# Patient Record
Sex: Male | Born: 1980 | Race: Black or African American | Hispanic: No | Marital: Single | Smoking: Current every day smoker
Health system: Southern US, Community
[De-identification: ages and names within clinical notes are randomized; demographics above are authoritative.]

## PROBLEM LIST (undated history)

## (undated) DIAGNOSIS — S52502A Unspecified fracture of the lower end of left radius, initial encounter for closed fracture: Secondary | ICD-10-CM

---
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
## 2002-08-07 ENCOUNTER — Emergency Department (HOSPITAL_COMMUNITY): Admission: EM | Admit: 2002-08-07 | Discharge: 2002-08-07 | Payer: Self-pay | Admitting: Emergency Medicine

## 2002-08-25 ENCOUNTER — Encounter: Payer: Self-pay | Admitting: Family Medicine

## 2002-08-25 ENCOUNTER — Encounter: Admission: RE | Admit: 2002-08-25 | Discharge: 2002-08-25 | Payer: Self-pay | Admitting: Family Medicine

## 2004-06-08 ENCOUNTER — Emergency Department (HOSPITAL_COMMUNITY): Admission: AC | Admit: 2004-06-08 | Discharge: 2004-06-08 | Payer: Self-pay

## 2004-06-15 ENCOUNTER — Emergency Department (HOSPITAL_COMMUNITY): Admission: EM | Admit: 2004-06-15 | Discharge: 2004-06-15 | Payer: Self-pay | Admitting: Emergency Medicine

## 2004-12-14 ENCOUNTER — Emergency Department (HOSPITAL_COMMUNITY): Admission: EM | Admit: 2004-12-14 | Discharge: 2004-12-14 | Payer: Self-pay | Admitting: Emergency Medicine

## 2008-06-08 ENCOUNTER — Emergency Department (HOSPITAL_COMMUNITY): Admission: EM | Admit: 2008-06-08 | Discharge: 2008-06-08 | Payer: Self-pay | Admitting: Emergency Medicine

## 2008-07-28 ENCOUNTER — Inpatient Hospital Stay (HOSPITAL_COMMUNITY): Admission: EM | Admit: 2008-07-28 | Discharge: 2008-07-31 | Payer: Self-pay | Admitting: Emergency Medicine

## 2008-07-29 HISTORY — PX: FEMUR FRACTURE SURGERY: SHX633

## 2008-07-29 HISTORY — PX: WOUND DEBRIDEMENT: SHX247

## 2008-07-29 HISTORY — PX: KNEE ASPIRATION: SHX1892

## 2008-09-26 ENCOUNTER — Emergency Department (HOSPITAL_COMMUNITY): Admission: EM | Admit: 2008-09-26 | Discharge: 2008-09-26 | Payer: Self-pay | Admitting: Emergency Medicine

## 2010-09-16 ENCOUNTER — Emergency Department (HOSPITAL_COMMUNITY)
Admission: EM | Admit: 2010-09-16 | Discharge: 2010-09-16 | Payer: Self-pay | Source: Home / Self Care | Admitting: Emergency Medicine

## 2010-11-09 ENCOUNTER — Emergency Department (HOSPITAL_COMMUNITY)
Admission: EM | Admit: 2010-11-09 | Discharge: 2010-11-09 | Payer: Self-pay | Source: Home / Self Care | Admitting: Emergency Medicine

## 2011-01-08 ENCOUNTER — Inpatient Hospital Stay (INDEPENDENT_AMBULATORY_CARE_PROVIDER_SITE_OTHER)
Admission: RE | Admit: 2011-01-08 | Discharge: 2011-01-08 | Disposition: A | Discharge status: Home / Self Care | Payer: Self-pay | Source: Ambulatory Visit | Attending: Emergency Medicine | Admitting: Emergency Medicine

## 2011-01-08 DIAGNOSIS — A64 Unspecified sexually transmitted disease: Secondary | ICD-10-CM

## 2011-01-08 DIAGNOSIS — R369 Urethral discharge, unspecified: Secondary | ICD-10-CM
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
## 2011-08-24 LAB — GC/CHLAMYDIA PROBE AMP, GENITAL
Chlamydia, DNA Probe: NEGATIVE
GC Probe Amp, Genital: POSITIVE — AB

## 2011-08-30 LAB — CBC
HCT: 32.8 — ABNORMAL LOW
HCT: 39.8
Hemoglobin: 11.4 — ABNORMAL LOW
Hemoglobin: 11.4 — ABNORMAL LOW
Hemoglobin: 11.8 — ABNORMAL LOW
MCHC: 33.1
MCHC: 33.8
MCHC: 34.7
MCV: 101.7 — ABNORMAL HIGH
MCV: 99.3
Platelets: 164
Platelets: 173
Platelets: 206
RBC: 3.31 — ABNORMAL LOW
RBC: 3.91 — ABNORMAL LOW
RDW: 12.6
RDW: 12.8
RDW: 12.9
WBC: 4.9
WBC: 9.1

## 2011-08-30 LAB — BASIC METABOLIC PANEL
BUN: 2 — ABNORMAL LOW
BUN: 4 — ABNORMAL LOW
CO2: 28
CO2: 29
Calcium: 8.4
Calcium: 9
Calcium: 9.1
Chloride: 104
GFR calc Af Amer: >60
GFR calc non Af Amer: >60
GFR calc non Af Amer: >60
Glucose, Bld: 106 — ABNORMAL HIGH
Glucose, Bld: 109 — ABNORMAL HIGH
Glucose, Bld: 93
Potassium: 3.7
Sodium: 136
Sodium: 138
Sodium: 138

## 2011-08-30 LAB — POCT I-STAT, CHEM 8
BUN: 8
Chloride: 105
Creatinine, Ser: 1
Sodium: 140

## 2011-08-30 LAB — ETHANOL: Alcohol, Ethyl (B): 206 — ABNORMAL HIGH

## 2011-08-30 LAB — PROTIME-INR: INR: 1

## 2013-12-23 ENCOUNTER — Emergency Department (INDEPENDENT_AMBULATORY_CARE_PROVIDER_SITE_OTHER)
Admission: EM | Admit: 2013-12-23 | Discharge: 2013-12-23 | Disposition: A | Discharge status: Home / Self Care | Payer: Self-pay | Source: Home / Self Care | Attending: Family Medicine | Admitting: Family Medicine

## 2013-12-23 ENCOUNTER — Encounter (HOSPITAL_COMMUNITY): Payer: Self-pay | Admitting: Emergency Medicine

## 2013-12-23 DIAGNOSIS — K089 Disorder of teeth and supporting structures, unspecified: Secondary | ICD-10-CM

## 2013-12-23 DIAGNOSIS — K0889 Other specified disorders of teeth and supporting structures: Secondary | ICD-10-CM

## 2013-12-23 DIAGNOSIS — K029 Dental caries, unspecified: Secondary | ICD-10-CM

## 2013-12-23 MED ORDER — IBUPROFEN 800 MG PO TABS: 800.0000 mg | ORAL_TABLET | Freq: Three times a day (TID) | ORAL | Status: DC

## 2013-12-23 MED ORDER — AMOXICILLIN 875 MG PO TABS: 875.0000 mg | ORAL_TABLET | Freq: Two times a day (BID) | ORAL | Status: DC

## 2013-12-23 MED ORDER — HYDROCODONE-ACETAMINOPHEN 5-325 MG PO TABS: 1.0000 | ORAL_TABLET | Freq: Four times a day (QID) | ORAL | Status: DC | PRN

## 2013-12-23 NOTE — ED Notes (Signed)
C/o  Dental pain pain bottom left broken tooth.  Having pain and swelling no relief with otc meds.  On going for some time now off/on.

## 2013-12-23 NOTE — ED Provider Notes (Signed)
CSN: [id number]     Arrival date & time 12/23/13  1453 History   None    Chief Complaint  Patient presents with  . Dental Pain   (Consider location/radiation/quality/duration/timing/severity/associated sxs/prior Treatment) HPI 33 year old male presents complaining of dental caries, left face pain, left headache, swollen lymph nodes in the left side of his neck. This is been present for about one week, getting progressively worse. He has already worked on finding a Education officer, community and has an appointment set up next week. The pain has become increasingly severe and he does not think he'll be a weight down on. He denies any discharge or bad taste in his mouth. Denies any systemic symptoms. He does not feel sick at this time.  History reviewed. No pertinent past medical history. Past Surgical History  Procedure Laterality Date  . Knee surgery    . Cervical spine surgery     History reviewed. No pertinent family history. History  Substance Use Topics  . Smoking status: Current Every Day Smoker -- 0.50 packs/day    Types: Cigarettes  . Smokeless tobacco: Not on file  . Alcohol Use: Yes    Review of Systems  Constitutional: Negative for fever, chills and fatigue.  HENT: Positive for dental problem. Negative for sore throat.   Eyes: Negative for visual disturbance.  Respiratory: Negative for cough and shortness of breath.   Cardiovascular: Negative for chest pain, palpitations and leg swelling.  Gastrointestinal: Negative for nausea, vomiting, abdominal pain, diarrhea and constipation.  Genitourinary: Negative for dysuria, urgency, frequency and hematuria.  Musculoskeletal: Negative for arthralgias, myalgias, neck pain and neck stiffness.  Skin: Negative for rash.  Neurological: Positive for headaches. Negative for dizziness, weakness and light-headedness.  Hematological: Positive for adenopathy.    Allergies  Review of patient's allergies indicates no known allergies.  Home Medications    Current Outpatient Rx  Name  Route  Sig  Dispense  Refill  . amoxicillin (AMOXIL) 875 MG tablet   Oral   Take 1 tablet (875 mg total) by mouth 2 (two) times daily.   28 tablet   0   . HYDROcodone-acetaminophen (NORCO) 5-325 MG per tablet   Oral   Take 1 tablet by mouth every 6 (six) hours as needed for moderate pain.   20 tablet   0   . ibuprofen (ADVIL,MOTRIN) 800 MG tablet   Oral   Take 1 tablet (800 mg total) by mouth 3 (three) times daily.   60 tablet   0    BP 162/97  Pulse 90  Temp(Src) 98.4 F (36.9 C) (Oral)  Resp 14  SpO2 99% Physical Exam  Nursing note and vitals reviewed. Constitutional: He is oriented to person, place, and time. He appears well-developed and well-nourished. No distress.  HENT:  Head: Normocephalic.  Nose: Nose normal. Right sinus exhibits no maxillary sinus tenderness and no frontal sinus tenderness. Left sinus exhibits no maxillary sinus tenderness and no frontal sinus tenderness.  Mouth/Throat: Uvula is midline, oropharynx is clear and moist and mucous membranes are normal. Abnormal dentition. Dental caries present. No dental abscesses.  Neck: Normal range of motion. Neck supple.  Pulmonary/Chest: Effort normal. No respiratory distress.  Lymphadenopathy:    He has cervical adenopathy (tonsillar, posterior cervical).  Neurological: He is alert and oriented to person, place, and time. Coordination normal.  Skin: Skin is warm and dry. No rash noted. He is not diaphoretic.  Psychiatric: He has a normal mood and affect. Judgment normal.    ED Course  Procedures (including critical care time) Labs Review Labs Reviewed - No data to display Imaging Review No results found.    MDM   1. Dental caries   2. Toothache    Referred to dental.  NSAID, norco, amoxicillin.  F/U PRN    Meds ordered this encounter  Medications  . amoxicillin (AMOXIL) 875 MG tablet    Sig: Take 1 tablet (875 mg total) by mouth 2 (two) times daily.     Dispense:  28 tablet    Refill:  0  . ibuprofen (ADVIL,MOTRIN) 800 MG tablet    Sig: Take 1 tablet (800 mg total) by mouth 3 (three) times daily.    Dispense:  60 tablet    Refill:  0  . HYDROcodone-acetaminophen (NORCO) 5-325 MG per tablet    Sig: Take 1 tablet by mouth every 6 (six) hours as needed for moderate pain.    Dispense:  20 tablet    Refill:  0       Graylon GoodZachary H Keshon Markovitz, PA-C 12/23/13 1610

## 2013-12-23 NOTE — Discharge Instructions (Signed)
Dental Caries  °Dental caries (also called tooth decay) is the most common oral disease. It can occur at any age, but is more common in children and young adults.  °HOW DENTAL CARIES DEVELOPS  °The process of decay begins when bacteria and foods (particularly sugars and starches) combine in your mouth to produce plaque. Plaque is a substance that sticks to the hard, outer surface of a tooth (enamel). The bacteria in plaque produce acids that attack enamel. These acids may also attack the root surface of a tooth (cementum) if it is exposed. Repeated attacks dissolve these surfaces and create holes in the tooth (cavities). If left untreated, the acids destroy the other layers of the tooth.  °RISK FACTORS °· Frequent sipping of sugary beverages.   °· Frequent snacking on sugary and starchy foods, especially those that easily get stuck in the teeth.   °· Poor oral hygiene.   °· Dry mouth.   °· Substance abuse such as methamphetamine abuse.   °· Broken or poor-fitting dental restorations.   °· Eating disorders.   °· Gastroesophageal reflux disease (GERD).   °· Certain radiation treatments to the head and neck. °SYMPTOMS °In the early stages of dental caries, symptoms are seldom present. Sometimes white, chalky areas may be seen on the enamel or other tooth layers. In later stages, symptoms may include: °· Pits and holes on the enamel. °· Toothache after sweet, hot, or cold foods or drinks are consumed. °· Pain around the tooth. °· Swelling around the tooth. °DIAGNOSIS  °Most of the time, dental caries is detected during a regular dental checkup. A diagnosis is made after a thorough medical and dental history is taken and the surfaces of your teeth are checked for signs of dental caries. Sometimes special instruments, such as lasers, are used to check for dental caries. Dental X-ray exams may be taken so that areas not visible to the eye (such as between the contact areas of the teeth) can be checked for cavities.    °TREATMENT  °If dental caries is in its early stages, it may be reversed with a fluoride treatment or an application of a remineralizing agent at the dental office. Thorough brushing and flossing at home is needed to aid these treatments. If it is in its later stages, treatment depends on the location and extent of tooth destruction:  °· If a small area of the tooth has been destroyed, the destroyed area will be removed and cavities will be filled with a material such as gold, silver amalgam, or composite resin.   °· If a large area of the tooth has been destroyed, the destroyed area will be removed and a cap (crown) will be fitted over the remaining tooth structure.   °· If the center part of the tooth (pulp) is affected, a procedure called a root canal will be needed before a filling or crown can be placed.   °· If most of the tooth has been destroyed, the tooth may need to be pulled (extracted). °HOME CARE INSTRUCTIONS °You can prevent, stop, or reverse dental caries at home by practicing good oral hygiene. Good oral hygiene includes: °· Thoroughly cleaning your teeth at least twice a day with a toothbrush and dental floss.   °· Using a fluoride toothpaste. A fluoride mouth rinse may also be used if recommended by your dentist or health care provider.   °· Restricting the amount of sugary and starchy foods and sugary liquids you consume.   °· Avoiding frequent snacking on these foods and sipping of these liquids.   °· Keeping regular visits   with a dentist for checkups and cleanings. °PREVENTION  °· Practice good oral hygiene. °· Consider a dental sealant. A dental sealant is a coating material that is applied by your dentist to the pits and grooves of teeth. The sealant prevents food from being trapped in them. It may protect the teeth for several years. °· Ask about fluoride supplements if you live in a community without fluorinated water or with water that has a low fluoride content. Use fluoride supplements  as directed by your dentist or health care provider. °· Allow fluoride varnish applications to teeth if directed by your dentist or health care provider. °Document Released: 08/05/2002 Document Revised: 07/16/2013 Document Reviewed: 11/15/2012 °ExitCare® Patient Information ©2014 [employer]. ° °Dental Pain °A tooth ache may be caused by cavities (tooth decay). Cavities expose the nerve of the tooth to air and hot or cold temperatures. It may come from an infection or abscess (also called a boil or furuncle) around your tooth. It is also often caused by dental caries (tooth decay). This causes the pain you are having. °DIAGNOSIS  °Your caregiver can diagnose this problem by exam. °TREATMENT  °· If caused by an infection, it may be treated with medications which kill germs (antibiotics) and pain medications as prescribed by your caregiver. Take medications as directed. °· Only take over-the-counter or prescription medicines for pain, discomfort, or fever as directed by your caregiver. °· Whether the tooth ache today is caused by infection or dental disease, you should see your dentist as soon as possible for further care. °SEEK MEDICAL CARE IF: °The exam and treatment you received today has been provided on an emergency basis only. This is not a substitute for complete medical or dental care. If your problem worsens or new problems (symptoms) appear, and you are unable to meet with your dentist, call or return to this location. °SEEK IMMEDIATE MEDICAL CARE IF:  °· You have a fever. °· You develop redness and swelling of your face, jaw, or neck. °· You are unable to open your mouth. °· You have severe pain uncontrolled by pain medicine. °MAKE SURE YOU:  °· Understand these instructions. °· Will watch your condition. °· Will get help right away if you are not doing well or get worse. °Document Released: 11/13/2005 Document Revised: 02/05/2012 Document Reviewed: 07/01/2008 °ExitCare® Patient Information ©2014  [employer]. ° °

## 2013-12-24 NOTE — ED Provider Notes (Signed)
Medical screening examination/treatment/procedure(s) were performed by a resident physician or non-physician practitioner and as the supervising physician I was immediately available for consultation/collaboration.  Darsi Tien, MD    Juda Lajeunesse S Falicity Sheets, MD 12/24/13 0755 

## 2014-05-04 ENCOUNTER — Emergency Department (INDEPENDENT_AMBULATORY_CARE_PROVIDER_SITE_OTHER): Payer: Self-pay

## 2014-05-04 ENCOUNTER — Emergency Department (INDEPENDENT_AMBULATORY_CARE_PROVIDER_SITE_OTHER)
Admission: EM | Admit: 2014-05-04 | Discharge: 2014-05-04 | Disposition: A | Discharge status: Home / Self Care | Payer: Self-pay | Source: Home / Self Care | Attending: Family Medicine | Admitting: Family Medicine

## 2014-05-04 ENCOUNTER — Encounter (HOSPITAL_COMMUNITY): Payer: Self-pay | Admitting: Emergency Medicine

## 2014-05-04 DIAGNOSIS — S60229A Contusion of unspecified hand, initial encounter: Secondary | ICD-10-CM

## 2014-05-04 DIAGNOSIS — X58XXXA Exposure to other specified factors, initial encounter: Secondary | ICD-10-CM

## 2014-05-04 DIAGNOSIS — S60222A Contusion of left hand, initial encounter: Secondary | ICD-10-CM

## 2014-05-04 MED ORDER — IBUPROFEN 800 MG PO TABS: 800.0000 mg | ORAL_TABLET | Freq: Three times a day (TID) | ORAL | Status: DC
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
## 2014-05-04 NOTE — ED Provider Notes (Signed)
CSN: [id number]     Arrival date & time 05/04/14  9166 History   First MD Initiated Contact with Patient 05/04/14 0830     Chief Complaint  Patient presents with  . Hand Injury   (Consider location/radiation/quality/duration/timing/severity/associated sxs/prior Treatment) Patient is a 33 y.o. male presenting with hand injury. The history is provided by the patient. No language interpreter was used.  Hand Injury Location:  Hand Time since incident:  4 days Injury: no   Hand location:  L hand Pain details:    Quality:  Aching   Radiates to:  Does not radiate   Severity:  No pain   Onset quality:  Gradual   Timing:  Constant   Progression:  Worsening Chronicity:  New Handedness:  Right-handed Dislocation: no   Foreign body present:  No foreign bodies Ineffective treatments:  None tried   History reviewed. No pertinent past medical history. Past Surgical History  Procedure Laterality Date  . Knee surgery    . Cervical spine surgery     History reviewed. No pertinent family history. History  Substance Use Topics  . Smoking status: Current Every Day Smoker -- 0.50 packs/day    Types: Cigarettes  . Smokeless tobacco: Not on file  . Alcohol Use: Yes    Review of Systems  Musculoskeletal: Positive for myalgias.  All other systems reviewed and are negative.   Allergies  Review of patient's allergies indicates no known allergies.  Home Medications   Prior to Admission medications   Medication Sig Start Date End Date Taking? Authorizing Provider  amoxicillin (AMOXIL) 875 MG tablet Take 1 tablet (875 mg total) by mouth 2 (two) times daily. 12/23/13   Graylon Good, PA-C  HYDROcodone-acetaminophen (NORCO) 5-325 MG per tablet Take 1 tablet by mouth every 6 (six) hours as needed for moderate pain. 12/23/13   Graylon Good, PA-C  ibuprofen (ADVIL,MOTRIN) 800 MG tablet Take 1 tablet (800 mg total) by mouth 3 (three) times daily. 12/23/13   Adrian Blackwater Baker, PA-C   BP 143/77   Pulse 103  Temp(Src) 98.2 F (36.8 C) (Oral)  Resp 12  SpO2 100% Physical Exam  Nursing note and vitals reviewed. Constitutional: He is oriented to person, place, and time. He appears well-developed and well-nourished.  Musculoskeletal: He exhibits tenderness.  Swollen tender hand from  nv and ns intact  Neurological: He is alert and oriented to person, place, and time.  Skin: Skin is warm.  Psychiatric: He has a normal mood and affect.    ED Course  Procedures (including critical care time) Labs Review Labs Reviewed - No data to display  Imaging Review Dg Hand Complete Left  05/04/2014   CLINICAL DATA:  Hand injury for 3 days  EXAM: LEFT HAND - COMPLETE 3+ VIEW  COMPARISON:  None.  FINDINGS: There is no evidence of fracture or dislocation. There is no evidence of arthropathy or other focal bone abnormality. Soft tissues are unremarkable.  IMPRESSION: Negative.   Electronically Signed   By: Amie Portland M.D.   On: 05/04/2014 08:50     MDM   1. Contusion of hand, left    Ace wrap and ibuprofen    Lonia Skinner [place], [place] 05/04/14 [phone]

## 2014-05-04 NOTE — Discharge Instructions (Signed)
Hand Contusion °A hand contusion is a deep bruise on your hand area. Contusions are the result of an injury that caused bleeding under the skin. The contusion may turn blue, purple, or yellow. Minor injuries will give you a painless contusion, but more severe contusions may stay painful and swollen for a few weeks. °CAUSES  °A contusion is usually caused by a blow, trauma, or direct force to an area of the body. °SYMPTOMS  °· Swelling and redness of the injured area. °· Discoloration of the injured area. °· Tenderness and soreness of the injured area. °· Pain. °DIAGNOSIS  °The diagnosis can be made by taking a history and performing a physical exam. An X-ray, CT scan, or MRI may be needed to determine if there were any associated injuries, such as broken bones (fractures). °TREATMENT  °Often, the best treatment for a hand contusion is resting, elevating, icing, and applying cold compresses to the injured area. Over-the-counter medicines may also be recommended for pain control. °HOME CARE INSTRUCTIONS  °· Put ice on the injured area. °· Put ice in a plastic bag. °· Place a towel between your skin and the bag. °· Leave the ice on for 15-20 minutes, 03-04 times a day. °· Only take over-the-counter or prescription medicines as directed by your caregiver. Your caregiver may recommend avoiding anti-inflammatory medicines (aspirin, ibuprofen, and naproxen) for 48 hours because these medicines may increase bruising. °· If told, use an elastic wrap as directed. This can help reduce swelling. You may remove the wrap for sleeping, showering, and bathing. If your fingers become numb, cold, or blue, take the wrap off and reapply it more loosely. °· Elevate your hand with pillows to reduce swelling. °· Avoid overusing your hand if it is painful. °SEEK IMMEDIATE MEDICAL CARE IF:  °· You have increased redness, swelling, or pain in your hand. °· Your swelling or pain is not relieved with medicines. °· You have loss of feeling in  your hand or are unable to move your fingers. °· Your hand turns cold or blue. °· You have pain when you move your fingers. °· Your hand becomes warm to the touch. °· Your contusion does not improve in 2 days. °MAKE SURE YOU:  °· Understand these instructions. °· Will watch your condition. °· Will get help right away if you are not doing well or get worse. °Document Released: 05/05/2002 Document Revised: 08/07/2012 Document Reviewed: 05/06/2012 °ExitCare® Patient Information ©2014 [employer]. ° °

## 2014-05-05 NOTE — ED Provider Notes (Signed)
Medical screening examination/treatment/procedure(s) were performed by a resident physician or non-physician practitioner and as the supervising physician I was immediately available for consultation/collaboration.  Romuald Mccaslin, MD    Calle Schader S Wahneta Derocher, MD 05/05/14 0746 

## 2014-08-27 DIAGNOSIS — S52502A Unspecified fracture of the lower end of left radius, initial encounter for closed fracture: Secondary | ICD-10-CM

## 2014-08-27 HISTORY — DX: Unspecified fracture of the lower end of left radius, initial encounter for closed fracture: S52.502A

## 2014-08-29 ENCOUNTER — Emergency Department (HOSPITAL_COMMUNITY): Payer: Self-pay

## 2014-08-29 ENCOUNTER — Encounter (HOSPITAL_COMMUNITY): Payer: Self-pay | Admitting: Emergency Medicine

## 2014-08-29 ENCOUNTER — Emergency Department (HOSPITAL_COMMUNITY)
Admission: EM | Admit: 2014-08-29 | Discharge: 2014-08-29 | Disposition: A | Discharge status: Home / Self Care | Payer: Self-pay | Attending: Emergency Medicine | Admitting: Emergency Medicine

## 2014-08-29 DIAGNOSIS — R000 Tachycardia, unspecified: Secondary | ICD-10-CM | POA: Insufficient documentation

## 2014-08-29 DIAGNOSIS — S52512A Displaced fracture of left radial styloid process, initial encounter for closed fracture: Secondary | ICD-10-CM | POA: Insufficient documentation

## 2014-08-29 DIAGNOSIS — S52612A Displaced fracture of left ulna styloid process, initial encounter for closed fracture: Secondary | ICD-10-CM | POA: Insufficient documentation

## 2014-08-29 DIAGNOSIS — Z720 Tobacco use: Secondary | ICD-10-CM | POA: Insufficient documentation

## 2014-08-29 DIAGNOSIS — S52502A Unspecified fracture of the lower end of left radius, initial encounter for closed fracture: Secondary | ICD-10-CM

## 2014-08-29 DIAGNOSIS — W19XXXA Unspecified fall, initial encounter: Secondary | ICD-10-CM

## 2014-08-29 MED ORDER — HYDROCODONE-ACETAMINOPHEN 5-325 MG PO TABS: 2.0000 | ORAL_TABLET | Freq: Once | ORAL | Status: DC

## 2014-08-29 MED ORDER — MORPHINE SULFATE 4 MG/ML IJ SOLN
6.0000 mg | INTRAMUSCULAR | Status: DC | PRN
  Administered 2014-08-29: 6 mg via INTRAVENOUS
  Filled 2014-08-29: qty 2

## 2014-08-29 MED ORDER — IBUPROFEN 800 MG PO TABS: 800.0000 mg | ORAL_TABLET | Freq: Once | ORAL | Status: DC

## 2014-08-29 MED ORDER — OXYCODONE-ACETAMINOPHEN 5-325 MG PO TABS: 1.0000 | ORAL_TABLET | ORAL | Status: DC | PRN

## 2014-08-29 MED ORDER — MORPHINE SULFATE 4 MG/ML IJ SOLN
6.0000 mg | Freq: Once | INTRAMUSCULAR | Status: AC
  Administered 2014-08-29: 6 mg via INTRAMUSCULAR
  Filled 2014-08-29: qty 2

## 2014-08-29 MED ORDER — OXYCODONE HCL 5 MG PO TABS
10.0000 mg | ORAL_TABLET | Freq: Once | ORAL | Status: AC
  Administered 2014-08-29: 10 mg via ORAL
  Filled 2014-08-29: qty 2
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
## 2014-08-29 NOTE — Discharge Instructions (Signed)
Wrist Fracture °A wrist fracture is a break or crack in one of the bones of your wrist. Your wrist is made up of eight small bones at the palm of your hand (carpal bones) and two long bones that make up your forearm (radius and ulna).  °CAUSES  °· A direct blow to the wrist. °· Falling on an outstretched hand. °· Trauma, such as a car accident or a fall. °RISK FACTORS °Risk factors for wrist fracture include:  °· Participating in contact and high-risk sports, such as skiing, biking, and ice skating. °· Taking steroid medicines. °· Smoking. °· Being male. °· Being Caucasian. °· Drinking more than three alcoholic beverages per day. °· Having low or lowered bone density (osteoporosis or osteopenia). °· Age. Older adults have decreased bone density. °· Women who have had menopause. °· History of previous fractures. °SIGNS AND SYMPTOMS °Symptoms of wrist fractures include tenderness, bruising, and inflammation. Additionally, the wrist may hang in an odd position or appear deformed.  °DIAGNOSIS °Diagnosis may include: °· Physical exam. °· X-ray. °TREATMENT °Treatment depends on many factors, including the nature and location of the fracture, your age, and your activity level. Treatment for wrist fracture can be nonsurgical or surgical.  °Nonsurgical Treatment °A plaster cast or splint may be applied to your wrist if the bone is in a good position. If the fracture is not in good position, it may be necessary for your health care provider to realign it before applying a splint or cast. Usually, a cast or splint will be worn for several weeks.  °Surgical Treatment °Sometimes the position of the bone is so far out of place that surgery is required to apply a device to hold it together as it heals. Depending on the fracture, there are a number of options for holding the bone in place while it heals, such as a cast and metal pins.   °HOME CARE INSTRUCTIONS °· Keep your injured wrist elevated and move your fingers as much as  possible. °· Do not put pressure on any part of your cast or splint. It may break.   °· Use a plastic bag to protect your cast or splint from water while bathing or showering. Do not lower your cast or splint into water. °· Take medicines only as directed by your health care provider. °· Keep your cast or splint clean and dry. If it becomes wet, damaged, or suddenly feels too tight, contact your health care provider right away. °· Do not use any tobacco products including cigarettes, chewing tobacco, or electronic cigarettes. Tobacco can delay bone healing. If you need help quitting, ask your health care provider. °· Keep all follow-up visits as directed by your health care provider. This is important. °· Ask your health care provider if you should take supplements of calcium and vitamins C and D to promote bone healing. °SEEK MEDICAL CARE IF:  °· Your cast or splint is damaged, breaks, or gets wet. °· You have a fever. °· You have chills. °· You have continued severe pain or more swelling than you did before the cast was put on. °SEEK IMMEDIATE MEDICAL CARE IF:  °· Your hand or fingernails on the injured arm turn blue or gray, or feel cold or numb. °· You have decreased feeling in the fingers of your injured arm. °MAKE SURE YOU: °· Understand these instructions. °· Will watch your condition. °· Will get help right away if you are not doing well or get worse. °Document Released: 08/23/2005 Document Revised:   03/30/2014 Document Reviewed: 12/01/2011 ExitCare Patient Information 2015 [place], [place]. This information is not intended to replace advice given to you by your health care provider. Make sure you discuss any questions you have with your health care provider.  Splint Care Splints protect and rest injuries. Splints can be made of plaster, fiberglass, or metal. They are used to treat broken bones, sprains, tendonitis, and other injuries. HOME CARE  Keep the injured area raised (elevated) while sitting or  lying down. Keep the injured body part just above the level of the heart. This will decrease puffiness (swelling) and pain.  If an elastic bandage was used to hold the splint, it can be loosened. Only loosen it to make room for puffiness and to ease pain.  Keep the splint clean and dry.  Do not scratch the skin under the splint with sharp or pointed objects.  Follow up with your doctor as told. GET HELP RIGHT AWAY IF:   There is more pain or pressure around the injury.  There is numbness, tingling, or pain in the toes or fingers past the injury.  The fingers or toes become cold or blue.  The splint becomes too soft or breaks before the injury is healed. MAKE SURE YOU:   Understand these instructions.  Will watch this condition.  Will get help right away if you are not doing well or get worse. Document Released: 08/22/2008 Document Revised: 02/05/2012 Document Reviewed: 08/22/2008 [facility] Patient Information 2015 [place], [place]. This information is not intended to replace advice given to you by your health care provider. Make sure you discuss any questions you have with your health care provider.  WORK STATUS: NO WORK AT ALL UNTIL AT LEAST DAY OF SURGERY, PLANNED TO BE 09-07-14

## 2014-08-29 NOTE — Consult Note (Signed)
ORTHOPAEDIC CONSULTATION HISTORY & PHYSICAL REQUESTING PHYSICIAN: Joshua M Zavitz, MD  Chief Complaint: Left wrist injury  HPI: [name] [name] is a 33 y.o. male who reportedly fell last night, sustaining a left wrist injury. He presents to the emergency room today, accompanied by his mother, complaining of left wrist pain and swelling.  History reviewed. No pertinent past medical history. Past Surgical History  Procedure Laterality Date  . Knee surgery    . Cervical spine surgery     History   Social History  . Marital Status: Single    Spouse Name: N/A    Number of Children: N/A  . Years of Education: N/A   Social History Main Topics  . Smoking status: Current Every Day Smoker -- 0.50 packs/day    Types: Cigarettes  . Smokeless tobacco: None  . Alcohol Use: Yes  . Drug Use: None  . Sexual Activity: Yes    Birth Control/ Protection: Condom   Other Topics Concern  . None   Social History Narrative  . None   No family history on file. Allergies  Allergen Reactions  . Shrimp [Shellfish Allergy] Anaphylaxis   Prior to Admission medications   Medication Sig Start Date End Date Taking? Authorizing Provider  oxyCODONE-acetaminophen (ROXICET) 5-325 MG per tablet Take 1-2 tablets by mouth every 4 (four) hours as needed. 08/29/14   Dejanae Helser A Beverley Allender, MD   Dg Wrist Complete Left  08/29/2014   CLINICAL DATA:  Post fall down stairs last night now with left wrist pain and swelling. Initial encounter.  EXAM: LEFT WRIST - COMPLETE 3+ VIEW  COMPARISON:  Left hand radiographs- 05/04/2014  FINDINGS: There is a comminuted and minimally displaced fracture involving the distal metaphysis and epiphysis of the radius, apex volar are with extension to involve the radial carpal and distal radioulnar joints. There is a minimally displaced fracture of the ulnar styloid process. Expected displacement of the pronator quadratus fat pad and diffuse soft tissue swelling about the wrist. No definite  dislocation. No radiopaque foreign body.  IMPRESSION: 1. Comminuted and minimally displaced fracture of the distal radius with extension to involve the radiocarpal and distal radioulnar joints. 2. Minimally displaced fracture of the ulnar styloid process.   Electronically Signed   By: John  Watts M.D.   On: 08/29/2014 10:32    Positive ROS: All other systems have been reviewed and were otherwise negative with the exception of those mentioned in the HPI and as above.  Physical Exam: Vitals: Refer to EMR. Constitutional:  WD, WN, NAD HEENT:  NCAT, EOMI Neuro/Psych:  Alert & oriented to person, place, and time; appropriate mood & affect Lymphatic: No generalized extremity edema or lymphadenopathy Extremities / MSK:  The extremities are normal with respect to appearance, ranges of motion, joint stability, muscle strength/tone, sensation, & perfusion except as otherwise noted:  Left wrist is swollen, grossly well aligned. Tender about the distal radius. Intact light touch sensibility on the median, ulnar, and radial nerve distributions with intact motor to the same. No tenderness about the elbow.  Assessment: Intra-articular comminuted displaced this radius fracture with approximately 18 dorsal tilt, but insignificant dorsal translation  Plan: I discussed these findings with him and his mother. He indicated that ultimately he would require surgical treatment for this. I discussed the goals, risks, and options with him and he consented to proceed in this fashion. Sugar tong splint will be placed today, along with sling and appropriate instructions. Our office will contact him this week to arrange   for open treatment, likely a [facility] on Monday, 09-07-14.  Cliffton Astersavid A. Janee Mornhompson, MD      [facility]thopaedic & Hand [facility] [address] [place], [place]  [id number] Office: [phone] Mobile: [phone]

## 2014-08-29 NOTE — ED Notes (Signed)
Orthopedics at bedside 
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
## 2014-08-29 NOTE — Progress Notes (Signed)
Orthopedic Tech Progress Note Patient Details:  [name] [name] [birth year] [id number]  Ortho Devices Type of Ortho Device: Arm sling;Sugartong splint;Ace wrap Ortho Device/Splint Interventions: Application   Cammer, Mickie BailJennifer Carol 08/29/2014, 12:34 PM

## 2014-08-31 ENCOUNTER — Other Ambulatory Visit: Payer: Self-pay | Admitting: Orthopedic Surgery

## 2014-09-01 ENCOUNTER — Encounter (HOSPITAL_BASED_OUTPATIENT_CLINIC_OR_DEPARTMENT_OTHER): Payer: Self-pay | Admitting: *Deleted

## 2014-09-07 ENCOUNTER — Ambulatory Visit (HOSPITAL_BASED_OUTPATIENT_CLINIC_OR_DEPARTMENT_OTHER)
Admission: RE | Admit: 2014-09-07 | Discharge: 2014-09-07 | Disposition: A | Discharge status: Home / Self Care | Payer: Self-pay | Source: Ambulatory Visit | Attending: Orthopedic Surgery | Admitting: Orthopedic Surgery

## 2014-09-07 ENCOUNTER — Ambulatory Visit (HOSPITAL_COMMUNITY): Payer: Self-pay

## 2014-09-07 ENCOUNTER — Ambulatory Visit (HOSPITAL_BASED_OUTPATIENT_CLINIC_OR_DEPARTMENT_OTHER): Payer: Self-pay | Admitting: Anesthesiology

## 2014-09-07 ENCOUNTER — Encounter (HOSPITAL_BASED_OUTPATIENT_CLINIC_OR_DEPARTMENT_OTHER): Payer: Self-pay

## 2014-09-07 ENCOUNTER — Encounter (HOSPITAL_BASED_OUTPATIENT_CLINIC_OR_DEPARTMENT_OTHER): Payer: Self-pay | Admitting: Anesthesiology

## 2014-09-07 ENCOUNTER — Encounter (HOSPITAL_BASED_OUTPATIENT_CLINIC_OR_DEPARTMENT_OTHER)
Admission: RE | Disposition: A | Discharge status: Home / Self Care | Payer: Self-pay | Source: Ambulatory Visit | Attending: Orthopedic Surgery

## 2014-09-07 DIAGNOSIS — Y92008 Other place in unspecified non-institutional (private) residence as the place of occurrence of the external cause: Secondary | ICD-10-CM | POA: Insufficient documentation

## 2014-09-07 DIAGNOSIS — Z91013 Allergy to seafood: Secondary | ICD-10-CM | POA: Insufficient documentation

## 2014-09-07 DIAGNOSIS — S52572A Other intraarticular fracture of lower end of left radius, initial encounter for closed fracture: Secondary | ICD-10-CM | POA: Insufficient documentation

## 2014-09-07 DIAGNOSIS — W108XXA Fall (on) (from) other stairs and steps, initial encounter: Secondary | ICD-10-CM | POA: Insufficient documentation

## 2014-09-07 DIAGNOSIS — F17210 Nicotine dependence, cigarettes, uncomplicated: Secondary | ICD-10-CM | POA: Insufficient documentation

## 2014-09-07 DIAGNOSIS — Y999 Unspecified external cause status: Secondary | ICD-10-CM | POA: Insufficient documentation

## 2014-09-07 DIAGNOSIS — Y9389 Activity, other specified: Secondary | ICD-10-CM | POA: Insufficient documentation

## 2014-09-07 DIAGNOSIS — S62102A Fracture of unspecified carpal bone, left wrist, initial encounter for closed fracture: Secondary | ICD-10-CM

## 2014-09-07 HISTORY — DX: Unspecified fracture of the lower end of left radius, initial encounter for closed fracture: S52.502A

## 2014-09-07 HISTORY — PX: OPEN REDUCTION INTERNAL FIXATION (ORIF) DISTAL RADIAL FRACTURE: SHX5989

## 2014-09-07 LAB — POCT HEMOGLOBIN-HEMACUE: Hemoglobin: 14.1 g/dL (ref 13.0–17.0)

## 2014-09-07 SURGERY — OPEN REDUCTION INTERNAL FIXATION (ORIF) DISTAL RADIUS FRACTURE
Anesthesia: General | Site: Wrist | Laterality: Left

## 2014-09-07 MED ORDER — HYDROMORPHONE HCL 1 MG/ML IJ SOLN
INTRAMUSCULAR | Status: AC
  Filled 2014-09-07: qty 1

## 2014-09-07 MED ORDER — FENTANYL CITRATE 0.05 MG/ML IJ SOLN: 50.0000 ug | INTRAMUSCULAR | Status: DC | PRN

## 2014-09-07 MED ORDER — LACTATED RINGERS IV SOLN
INTRAVENOUS | Status: DC
  Administered 2014-09-07 (×2): via INTRAVENOUS

## 2014-09-07 MED ORDER — LIDOCAINE HCL (CARDIAC) 20 MG/ML IV SOLN
INTRAVENOUS | Status: DC | PRN
Start: 2014-09-07 — End: 2014-09-07
  Administered 2014-09-07: 80 mg via INTRAVENOUS

## 2014-09-07 MED ORDER — FENTANYL CITRATE 0.05 MG/ML IJ SOLN
INTRAMUSCULAR | Status: DC | PRN
  Administered 2014-09-07 (×2): 25 ug via INTRAVENOUS
  Administered 2014-09-07: 50 ug via INTRAVENOUS
  Administered 2014-09-07: 25 ug via INTRAVENOUS
  Administered 2014-09-07: 100 ug via INTRAVENOUS
  Administered 2014-09-07 (×2): 25 ug via INTRAVENOUS

## 2014-09-07 MED ORDER — PROPOFOL 10 MG/ML IV BOLUS
INTRAVENOUS | Status: DC | PRN
  Administered 2014-09-07: 150 mg via INTRAVENOUS

## 2014-09-07 MED ORDER — DIPHENHYDRAMINE HCL 50 MG/ML IJ SOLN
12.5000 mg | Freq: Once | INTRAMUSCULAR | Status: AC
  Administered 2014-09-07: 12.5 mg via INTRAVENOUS

## 2014-09-07 MED ORDER — ONDANSETRON HCL 4 MG/2ML IJ SOLN: 4.0000 mg | Freq: Once | INTRAMUSCULAR | Status: DC | PRN

## 2014-09-07 MED ORDER — PROPOFOL 10 MG/ML IV BOLUS
INTRAVENOUS | Status: AC
  Filled 2014-09-07: qty 20

## 2014-09-07 MED ORDER — MIDAZOLAM HCL 2 MG/2ML IJ SOLN
INTRAMUSCULAR | Status: AC
  Filled 2014-09-07: qty 2

## 2014-09-07 MED ORDER — HYDROMORPHONE HCL 1 MG/ML IJ SOLN
0.5000 mg | INTRAMUSCULAR | Status: DC | PRN
  Administered 2014-09-07 (×2): 0.5 mg via INTRAVENOUS

## 2014-09-07 MED ORDER — BUPIVACAINE-EPINEPHRINE (PF) 0.5% -1:200000 IJ SOLN
INTRAMUSCULAR | Status: AC
  Filled 2014-09-07: qty 30

## 2014-09-07 MED ORDER — ONDANSETRON HCL 4 MG/2ML IJ SOLN
INTRAMUSCULAR | Status: DC | PRN
  Administered 2014-09-07: 4 mg via INTRAVENOUS

## 2014-09-07 MED ORDER — MIDAZOLAM HCL 5 MG/5ML IJ SOLN
INTRAMUSCULAR | Status: DC | PRN
  Administered 2014-09-07: 2 mg via INTRAVENOUS

## 2014-09-07 MED ORDER — LACTATED RINGERS IV SOLN: INTRAVENOUS | Status: DC

## 2014-09-07 MED ORDER — MIDAZOLAM HCL 2 MG/2ML IJ SOLN: 1.0000 mg | INTRAMUSCULAR | Status: DC | PRN

## 2014-09-07 MED ORDER — FENTANYL CITRATE 0.05 MG/ML IJ SOLN
INTRAMUSCULAR | Status: AC
Start: 2014-09-07 — End: 2014-09-07
  Filled 2014-09-07: qty 6

## 2014-09-07 MED ORDER — DEXAMETHASONE SODIUM PHOSPHATE 4 MG/ML IJ SOLN
INTRAMUSCULAR | Status: DC | PRN
  Administered 2014-09-07: 10 mg via INTRAVENOUS

## 2014-09-07 MED ORDER — FENTANYL CITRATE 0.05 MG/ML IJ SOLN
INTRAMUSCULAR | Status: AC
  Filled 2014-09-07: qty 2

## 2014-09-07 MED ORDER — OXYCODONE-ACETAMINOPHEN 5-325 MG PO TABS: 1.0000 | ORAL_TABLET | ORAL | Status: DC | PRN

## 2014-09-07 MED ORDER — 0.9 % SODIUM CHLORIDE (POUR BTL) OPTIME
TOPICAL | Status: DC | PRN
  Administered 2014-09-07: 300 mL

## 2014-09-07 MED ORDER — HYDROMORPHONE HCL 1 MG/ML IJ SOLN
0.2500 mg | INTRAMUSCULAR | Status: DC | PRN
  Administered 2014-09-07 (×4): 0.5 mg via INTRAVENOUS

## 2014-09-07 MED ORDER — OXYCODONE HCL 5 MG PO TABS
10.0000 mg | ORAL_TABLET | Freq: Once | ORAL | Status: AC | PRN
  Administered 2014-09-07: 10 mg via ORAL

## 2014-09-07 MED ORDER — OXYCODONE HCL 5 MG/5ML PO SOLN: 5.0000 mg | Freq: Once | ORAL | Status: AC | PRN

## 2014-09-07 MED ORDER — OXYCODONE HCL 5 MG PO TABS: 5.0000 mg | ORAL_TABLET | Freq: Once | ORAL | Status: DC | PRN

## 2014-09-07 MED ORDER — DIPHENHYDRAMINE HCL 50 MG/ML IJ SOLN
INTRAMUSCULAR | Status: AC
  Filled 2014-09-07: qty 1

## 2014-09-07 MED ORDER — CEFAZOLIN SODIUM-DEXTROSE 2-3 GM-% IV SOLR
2.0000 g | INTRAVENOUS | Status: AC
  Administered 2014-09-07: 2 g via INTRAVENOUS

## 2014-09-07 MED ORDER — MIDAZOLAM HCL 2 MG/ML PO SYRP: 12.0000 mg | ORAL_SOLUTION | Freq: Once | ORAL | Status: DC | PRN

## 2014-09-07 MED ORDER — OXYCODONE HCL 5 MG/5ML PO SOLN: 5.0000 mg | Freq: Once | ORAL | Status: DC | PRN

## 2014-09-07 MED ORDER — OXYCODONE HCL 5 MG PO TABS
ORAL_TABLET | ORAL | Status: AC
  Filled 2014-09-07: qty 2

## 2014-09-07 MED ORDER — BUPIVACAINE-EPINEPHRINE 0.5% -1:200000 IJ SOLN
INTRAMUSCULAR | Status: DC | PRN
  Administered 2014-09-07: 10 mL

## 2014-09-07 MED ORDER — CEFAZOLIN SODIUM-DEXTROSE 2-3 GM-% IV SOLR
INTRAVENOUS | Status: AC
  Filled 2014-09-07: qty 50

## 2014-09-07 SURGICAL SUPPLY — 67 items
BANDAGE COBAN STERILE 2 (GAUZE/BANDAGES/DRESSINGS) IMPLANT
BIT DRILL 2 FAST STEP (BIT) ×2 IMPLANT
BIT DRILL 2.5X4 QC (BIT) ×2 IMPLANT
BLADE MINI RND TIP GREEN BEAV (BLADE) IMPLANT
BLADE SURG 15 STRL LF DISP TIS (BLADE) ×1 IMPLANT
BLADE SURG 15 STRL SS (BLADE) ×6
BNDG CMPR 9X4 STRL LF SNTH (GAUZE/BANDAGES/DRESSINGS) ×1
BNDG COHESIVE 4X5 TAN STRL (GAUZE/BANDAGES/DRESSINGS) ×3 IMPLANT
BNDG ESMARK 4X9 LF (GAUZE/BANDAGES/DRESSINGS) ×3 IMPLANT
BNDG GAUZE ELAST 4 BULKY (GAUZE/BANDAGES/DRESSINGS) ×6 IMPLANT
BRUSH SCRUB EZ PLAIN DRY (MISCELLANEOUS) IMPLANT
CANISTER SUCT 1200ML W/VALVE (MISCELLANEOUS) ×3 IMPLANT
CHLORAPREP W/TINT 26ML (MISCELLANEOUS) ×3 IMPLANT
CORDS BIPOLAR (ELECTRODE) ×3 IMPLANT
COVER BACK TABLE 60X90IN (DRAPES) ×3 IMPLANT
COVER MAYO STAND STRL (DRAPES) ×3 IMPLANT
CUFF TOURNIQUET SINGLE 18IN (TOURNIQUET CUFF) ×2 IMPLANT
CUFF TOURNIQUET SINGLE 24IN (TOURNIQUET CUFF) IMPLANT
DRAPE C-ARM 42X72 X-RAY (DRAPES) ×3 IMPLANT
DRAPE EXTREMITY TIBURON (DRAPES) ×3 IMPLANT
DRAPE SURG 17X23 STRL (DRAPES) ×3 IMPLANT
DRSG ADAPTIC 3X8 NADH LF (GAUZE/BANDAGES/DRESSINGS) ×3 IMPLANT
DRSG EMULSION OIL 3X3 NADH (GAUZE/BANDAGES/DRESSINGS) IMPLANT
ELECT REM PT RETURN 9FT ADLT (ELECTROSURGICAL) ×3
ELECTRODE REM PT RTRN 9FT ADLT (ELECTROSURGICAL) ×1 IMPLANT
GAUZE SPONGE 4X4 12PLY STRL (GAUZE/BANDAGES/DRESSINGS) ×3 IMPLANT
GLOVE BIO SURGEON STRL SZ7.5 (GLOVE) ×3 IMPLANT
GLOVE BIOGEL PI IND STRL 7.0 (GLOVE) ×1 IMPLANT
GLOVE BIOGEL PI IND STRL 8 (GLOVE) ×1 IMPLANT
GLOVE BIOGEL PI INDICATOR 7.0 (GLOVE) ×4
GLOVE BIOGEL PI INDICATOR 8 (GLOVE) ×2
GLOVE ECLIPSE 6.5 STRL STRAW (GLOVE) ×5 IMPLANT
GLOVE EXAM NITRILE MD LF STRL (GLOVE) ×2 IMPLANT
GOWN STRL REUS W/ TWL LRG LVL3 (GOWN DISPOSABLE) ×1 IMPLANT
GOWN STRL REUS W/TWL LRG LVL3 (GOWN DISPOSABLE) ×6
GOWN STRL REUS W/TWL XL LVL3 (GOWN DISPOSABLE) ×3 IMPLANT
NDL HYPO 25X1 1.5 SAFETY (NEEDLE) IMPLANT
NEEDLE HYPO 25X1 1.5 SAFETY (NEEDLE) ×3 IMPLANT
NS IRRIG 1000ML POUR BTL (IV SOLUTION) ×3 IMPLANT
PACK BASIN DAY SURGERY FS (CUSTOM PROCEDURE TRAY) ×3 IMPLANT
PADDING CAST ABS 4INX4YD NS (CAST SUPPLIES) ×2
PADDING CAST ABS COTTON 4X4 ST (CAST SUPPLIES) IMPLANT
PEG SUBCHONDRAL SMOOTH 2.0X18 (Peg) ×2 IMPLANT
PEG SUBCHONDRAL SMOOTH 2.0X22 (Peg) ×2 IMPLANT
PEG SUBCHONDRAL SMOOTH 2.0X24 (Peg) ×4 IMPLANT
PEG SUBCHONDRAL SMOOTH 2.0X26 (Peg) ×4 IMPLANT
PEG THREADED 2.5MMX26MM LONG (Peg) ×2 IMPLANT
PENCIL BUTTON HOLSTER BLD 10FT (ELECTRODE) ×3 IMPLANT
PLATE SHORT 24.4X51.3 LT (Plate) ×2 IMPLANT
RUBBERBAND STERILE (MISCELLANEOUS) IMPLANT
SCREW CORT 3.5X14 LNG (Screw) ×4 IMPLANT
SCREW CORT 3.5X15 (Screw) ×2 IMPLANT
SLEEVE SCD COMPRESS KNEE MED (MISCELLANEOUS) ×3 IMPLANT
STOCKINETTE 6  STRL (DRAPES) ×2
STOCKINETTE 6 STRL (DRAPES) ×1 IMPLANT
SUCTION FRAZIER TIP 10 FR DISP (SUCTIONS) ×3 IMPLANT
SUT VIC AB 2-0 PS2 27 (SUTURE) ×3 IMPLANT
SUT VICRYL 4-0 PS2 18IN ABS (SUTURE) IMPLANT
SUT VICRYL RAPIDE 4-0 (SUTURE) IMPLANT
SUT VICRYL RAPIDE 4/0 PS 2 (SUTURE) ×3 IMPLANT
SYR BULB 3OZ (MISCELLANEOUS) ×3 IMPLANT
SYRINGE 10CC LL (SYRINGE) ×2 IMPLANT
TOWEL OR 17X24 6PK STRL BLUE (TOWEL DISPOSABLE) ×3 IMPLANT
TOWEL OR NON WOVEN STRL DISP B (DISPOSABLE) ×1 IMPLANT
TUBE CONNECTING 20'X1/4 (TUBING) ×1
TUBE CONNECTING 20X1/4 (TUBING) ×2 IMPLANT
UNDERPAD 30X30 INCONTINENT (UNDERPADS AND DIAPERS) ×3 IMPLANT

## 2014-09-07 NOTE — Anesthesia Preprocedure Evaluation (Signed)
Anesthesia Evaluation  Patient identified by MRN, date of birth, ID band Patient awake    Reviewed: Allergy & Precautions, H&P , NPO status , Patient's Chart, lab work & pertinent test results  Airway Mallampati: I  TM Distance: >3 FB Neck ROM: Full    Dental  (+) Teeth Intact, Dental Advisory Given   Pulmonary Current Smoker,  breath sounds clear to auscultation        Cardiovascular Rhythm:Regular Rate:Normal     Neuro/Psych    GI/Hepatic   Endo/Other    Renal/GU      Musculoskeletal   Abdominal   Peds  Hematology   Anesthesia Other Findings   Reproductive/Obstetrics                             Anesthesia Physical Anesthesia Plan  ASA: I  Anesthesia Plan: General   Post-op Pain Management:    Induction: Intravenous  Airway Management Planned: LMA  Additional Equipment:   Intra-op Plan:   Post-operative Plan: Extubation in OR  Informed Consent: I have reviewed the patients History and Physical, chart, labs and discussed the procedure including the risks, benefits and alternatives for the proposed anesthesia with the patient or authorized representative who has indicated his/her understanding and acceptance.   Dental advisory given  Plan Discussed with: CRNA, Anesthesiologist and Surgeon  Anesthesia Plan Comments:         Anesthesia Quick Evaluation  

## 2014-09-07 NOTE — Interval H&P Note (Signed)
History and Physical Interval Note:  09/07/2014 7:34 AM  [name] [name]  has presented today for surgery, with the diagnosis of Left distal radius fracture  The various methods of treatment have been discussed with the patient and family. After consideration of risks, benefits and other options for treatment, the patient has consented to  Procedure(s) with comments: OPEN REDUCTION INTERNAL FIXATION (ORIF) DISTAL RADIAL FRACTURE (Left) - Open treatment left distal radius fracture as a surgical intervention .  The patient's history has been reviewed, patient examined, no change in status, stable for surgery.  I have reviewed the patient's chart and labs.  Questions were answered to the patient's satisfaction.     [name]

## 2014-09-07 NOTE — Op Note (Addendum)
09/07/2014  7:34 AM  PATIENT:  [name] [name]  33 y.o. male  PRE-OPERATIVE DIAGNOSIS:  Displaced left intra-articular distal radius fracture  POST-OPERATIVE DIAGNOSIS:  Same  PROCEDURE:  ORIF left distal radius fx, CPT 25608  SURGEON: Cliffton Astersavid A. Janee Mornhompson, MD  PHYSICIAN ASSISTANT:Kirsten Eulis CannerSchrader, OPA-C  ANESTHESIA:  general  SPECIMENS:  None  DRAINS:   None  PREOPERATIVE INDICATIONS:  [name] is a  33 y.o. male with displaced left distal radius fracture  The risks benefits and alternatives were discussed with the patient preoperatively including but not limited to the risks of infection, bleeding, nerve injury, cardiopulmonary complications, the need for revision surgery, among others, and the patient verbalized understanding and consented to proceed.  OPERATIVE IMPLANTS: Bioment DVR standard short plate  OPERATIVE PROCEDURE: After receiving prophylactic antibiotics, the patient was escorted to the operative theatre and placed in a supine position. General anesthesia was administered.  A surgical "time-out" was performed during which the planned procedure, proposed operative site, and the correct patient identity were compared to the operative consent and agreement confirmed by the circulating nurse according to current facility policy. Following application of a tourniquet to the operative extremity, the exposed skin was pre-scrub with Hibiclens scrub brush and then was prepped with Chloraprep and draped in the usual sterile fashion. The limb was exsanguinated with an Esmarch bandage and the tourniquet inflated to approximately 100mmHg higher than systolic BP.   A sinusoidal-shaped incision was marked and made over the FCR axis and the distal forearm. The skin was incised sharply with scalpel, subcutaneous tissues with blunt and spreading dissection. The FCR axis was exploited deeply. The pronator quadratus was reflected in an L-shaped ulnarly and the brachioradialis was  split in a Z-plasty fashion for later reapproximation. The fracture was inspected and provisionally reduced.  This was confirmed fluoroscopically. The appropriately sized plate was selected and found to fit well. It was placed in its provisional alignment of the radius and this was confirmed fluoroscopically.  It was secured to the radius with a screw through the slotted hole.  Additional adjustments were made as necessary, and the distal holes were all drilled and filled.  Peg/screw length distally was selected on the shorter side of measurements to minimize the risk for dorsal cortical penetration. The remainder of the proximal holes were drilled and filled.   Final images were obtained and the DRUJ was examined for stability. It was found to be sufficiently stable. The wound was then copiously irrigated and the brachioradialis repaired with 2-0 Vicryl Rapide suture followed by repair of the pronator quadratus with the same suture type. Tourniquet was released and additional hemostasis obtained and the skin was closed with 2-0 Vicryl deep dermal buried sutures followed by running 4-0 Vicryl Rapide horizontal mattress suture in the skin. A bulky dressing with a volar plaster component was applied and she was taken to room stable condition.  DISPOSITION: The patient will be discharged home today with typical post-op instructions, returning in 10-15 days for reevaluation with new x-rays of the affected wrist out of the splint to include an inclined lateral and then transition to therapy to have a custom splint constructed and begin rehabilitation.

## 2014-09-07 NOTE — Anesthesia Procedure Notes (Signed)
Procedure Name: LMA Insertion Date/Time: 09/07/2014 7:42 AM Performed by: Gar GibbonKEETON, Keilyn Haggard S Pre-anesthesia Checklist: Patient identified, Emergency Drugs available, Suction available and Patient being monitored Patient Re-evaluated:Patient Re-evaluated prior to inductionOxygen Delivery Method: Circle System Utilized Preoxygenation: Pre-oxygenation with 100% oxygen Intubation Type: IV induction Ventilation: Mask ventilation without difficulty LMA: LMA inserted LMA Size: 4.0 Number of attempts: 1 Airway Equipment and Method: bite block Placement Confirmation: positive ETCO2 Tube secured with: Tape Dental Injury: Teeth and Oropharynx as per pre-operative assessment

## 2014-09-07 NOTE — Anesthesia Postprocedure Evaluation (Signed)
  Anesthesia Post-op Note  Patient: [name] [name]  Procedure(s) Performed: Procedure(s) with comments: OPEN REDUCTION INTERNAL FIXATION (ORIF) DISTAL RADIAL FRACTURE (Left) - Open treatment left distal radius fracture  Patient Location: PACU  Anesthesia Type: General   Level of Consciousness: awake, alert  and oriented  Airway and Oxygen Therapy: Patient Spontanous Breathing  Post-op Pain: mild  Post-op Assessment: Post-op Vital signs reviewed  Post-op Vital Signs: Reviewed  Last Vitals:  Filed Vitals:   09/07/14 1100  BP: 130/90  Pulse: 90  Temp: 36.8 C  Resp: 15    Complications: No apparent anesthesia complications

## 2014-09-07 NOTE — Discharge Instructions (Addendum)
Discharge Instructions ° ° °You have a dressing with a plaster splint incorporated in it. °Move your fingers as much as possible, making a full fist and fully opening the fist. °Elevate your hand to reduce pain & swelling of the digits.  Ice over the operative site may be helpful to reduce pain & swelling.  DO NOT USE HEAT. °Pain medicine has been prescribed for you.  °Use your medicine as needed over the first 48 hours, and then you can begin to taper your use.  You may use Tylenol in place of your prescribed pain medication, but not IN ADDITION to it. °Leave the dressing in place until you return to our office.  °You may shower, but keep the bandage clean & dry.  °You may drive a car when you are off of prescription pain medications and can safely control your vehicle with both hands. ° ° °Please call [phone] during normal business hours or [phone] after hours for any problems. Including the following: ° °- excessive redness of the incisions °- drainage for more than 4 days °- fever of more than 101.5 F ° °*Please note that pain medications will not be refilled after hours or on weekends. ° ° °Post Anesthesia Home Care Instructions ° °Activity: °Get plenty of rest for the remainder of the day. A responsible adult should stay with you for 24 hours following the procedure.  °For the next 24 hours, DO NOT: °-Drive a car °-Operate machinery °-Drink alcoholic beverages °-Take any medication unless instructed by your physician °-Make any legal decisions or sign important papers. ° °Meals: °Start with liquid foods such as gelatin or soup. Progress to regular foods as tolerated. Avoid greasy, spicy, heavy foods. If nausea and/or vomiting occur, drink only clear liquids until the nausea and/or vomiting subsides. Call your physician if vomiting continues. ° °Special Instructions/Symptoms: °Your throat may feel dry or sore from the anesthesia or the breathing tube placed in your throat during surgery. If this  causes discomfort, gargle with warm salt water. The discomfort should disappear within 24 hours. ° °

## 2014-09-07 NOTE — H&P (View-Only) (Signed)
ORTHOPAEDIC CONSULTATION HISTORY & PHYSICAL REQUESTING PHYSICIAN: Enid SkeensJoshua M Zavitz, MD  Chief Complaint: Left wrist injury  HPI: [name] [name] is a 33 y.o. male who reportedly fell last night, sustaining a left wrist injury. He presents to the emergency room today, accompanied by his mother, complaining of left wrist pain and swelling.  History reviewed. No pertinent past medical history. Past Surgical History  Procedure Laterality Date  . Knee surgery    . Cervical spine surgery     History   Social History  . Marital Status: Single    Spouse Name: N/A    Number of Children: N/A  . Years of Education: N/A   Social History Main Topics  . Smoking status: Current Every Day Smoker -- 0.50 packs/day    Types: Cigarettes  . Smokeless tobacco: None  . Alcohol Use: Yes  . Drug Use: None  . Sexual Activity: Yes    Birth Control/ Protection: Condom   Other Topics Concern  . None   Social History Narrative  . None   No family history on file. Allergies  Allergen Reactions  . Shrimp [Shellfish Allergy] Anaphylaxis   Prior to Admission medications   Medication Sig Start Date End Date Taking? Authorizing Provider  oxyCODONE-acetaminophen (ROXICET) 5-325 MG per tablet Take 1-2 tablets by mouth every 4 (four) hours as needed. 08/29/14   Jodi Marbleavid A Levelle Edelen, MD   Dg Wrist Complete Left  08/29/2014   CLINICAL DATA:  Post fall down stairs last night now with left wrist pain and swelling. Initial encounter.  EXAM: LEFT WRIST - COMPLETE 3+ VIEW  COMPARISON:  Left hand radiographs- 05/04/2014  FINDINGS: There is a comminuted and minimally displaced fracture involving the distal metaphysis and epiphysis of the radius, apex volar are with extension to involve the radial carpal and distal radioulnar joints. There is a minimally displaced fracture of the ulnar styloid process. Expected displacement of the pronator quadratus fat pad and diffuse soft tissue swelling about the wrist. No definite  dislocation. No radiopaque foreign body.  IMPRESSION: 1. Comminuted and minimally displaced fracture of the distal radius with extension to involve the radiocarpal and distal radioulnar joints. 2. Minimally displaced fracture of the ulnar styloid process.   Electronically Signed   By: Simonne ComeJohn  Watts M.D.   On: 08/29/2014 10:32    Positive ROS: All other systems have been reviewed and were otherwise negative with the exception of those mentioned in the HPI and as above.  Physical Exam: Vitals: Refer to EMR. Constitutional:  WD, WN, NAD HEENT:  NCAT, EOMI Neuro/Psych:  Alert & oriented to person, place, and time; appropriate mood & affect Lymphatic: No generalized extremity edema or lymphadenopathy Extremities / MSK:  The extremities are normal with respect to appearance, ranges of motion, joint stability, muscle strength/tone, sensation, & perfusion except as otherwise noted:  Left wrist is swollen, grossly well aligned. Tender about the distal radius. Intact light touch sensibility on the median, ulnar, and radial nerve distributions with intact motor to the same. No tenderness about the elbow.  Assessment: Intra-articular comminuted displaced this radius fracture with approximately 18 dorsal tilt, but insignificant dorsal translation  Plan: I discussed these findings with him and his mother. He indicated that ultimately he would require surgical treatment for this. I discussed the goals, risks, and options with him and he consented to proceed in this fashion. Sugar tong splint will be placed today, along with sling and appropriate instructions. Our office will contact him this week to arrange  for open treatment, likely a [facility] on Monday, 09-07-14.  Cliffton Astersavid A. Janee Mornhompson, MD      [facility]thopaedic & Hand Surgery [facility] [address] [place], [place]  [id number] Office: [phone] Mobile: [phone]

## 2014-09-07 NOTE — Transfer of Care (Signed)
Immediate Anesthesia Transfer of Care Note  Patient: [name] [name]  Procedure(s) Performed: Procedure(s) with comments: OPEN REDUCTION INTERNAL FIXATION (ORIF) DISTAL RADIAL FRACTURE (Left) - Open treatment left distal radius fracture  Patient Location: PACU  Anesthesia Type:General  Level of Consciousness: awake, sedated and patient cooperative  Airway & Oxygen Therapy: Patient Spontanous Breathing and Patient connected to face mask oxygen  Post-op Assessment: Report given to PACU RN and Post -op Vital signs reviewed and stable  Post vital signs: Reviewed and stable  Complications: No apparent anesthesia complications

## 2014-09-08 ENCOUNTER — Encounter (HOSPITAL_BASED_OUTPATIENT_CLINIC_OR_DEPARTMENT_OTHER): Payer: Self-pay | Admitting: Orthopedic Surgery

## 2014-09-17 ENCOUNTER — Emergency Department (HOSPITAL_COMMUNITY): Payer: Self-pay

## 2014-09-17 ENCOUNTER — Emergency Department (HOSPITAL_COMMUNITY)
Admission: EM | Admit: 2014-09-17 | Discharge: 2014-09-17 | Disposition: A | Discharge status: Home / Self Care | Payer: Self-pay | Attending: Emergency Medicine | Admitting: Emergency Medicine

## 2014-09-17 ENCOUNTER — Encounter (HOSPITAL_COMMUNITY): Payer: Self-pay | Admitting: Emergency Medicine

## 2014-09-17 DIAGNOSIS — S0285XA Fracture of orbit, unspecified, initial encounter for closed fracture: Secondary | ICD-10-CM

## 2014-09-17 DIAGNOSIS — Z720 Tobacco use: Secondary | ICD-10-CM | POA: Insufficient documentation

## 2014-09-17 DIAGNOSIS — S01512A Laceration without foreign body of oral cavity, initial encounter: Secondary | ICD-10-CM | POA: Insufficient documentation

## 2014-09-17 DIAGNOSIS — F1010 Alcohol abuse, uncomplicated: Secondary | ICD-10-CM | POA: Insufficient documentation

## 2014-09-17 DIAGNOSIS — S023XXA Fracture of orbital floor, initial encounter for closed fracture: Secondary | ICD-10-CM | POA: Insufficient documentation

## 2014-09-17 DIAGNOSIS — S022XXA Fracture of nasal bones, initial encounter for closed fracture: Secondary | ICD-10-CM | POA: Insufficient documentation

## 2014-09-17 DIAGNOSIS — S0181XA Laceration without foreign body of other part of head, initial encounter: Secondary | ICD-10-CM | POA: Insufficient documentation

## 2014-09-17 MED ORDER — LIDOCAINE-EPINEPHRINE (PF) 2 %-1:200000 IJ SOLN
10.0000 mL | Freq: Once | INTRAMUSCULAR | Status: AC
  Administered 2014-09-17: 10 mL
  Filled 2014-09-17: qty 10

## 2014-09-17 MED ORDER — OXYCODONE-ACETAMINOPHEN 5-325 MG PO TABS: 1.0000 | ORAL_TABLET | ORAL | Status: DC | PRN

## 2014-09-17 MED ORDER — LIDOCAINE HCL 1 % IJ SOLN
INTRAMUSCULAR | Status: AC
  Filled 2014-09-17: qty 20

## 2014-09-17 MED ORDER — AMOXICILLIN 500 MG PO CAPS: 1000.0000 mg | ORAL_CAPSULE | Freq: Two times a day (BID) | ORAL | Status: DC
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
## 2014-09-28 ENCOUNTER — Encounter (HOSPITAL_COMMUNITY): Payer: Self-pay | Admitting: Emergency Medicine

## 2014-09-28 ENCOUNTER — Emergency Department (INDEPENDENT_AMBULATORY_CARE_PROVIDER_SITE_OTHER)
Admission: EM | Admit: 2014-09-28 | Discharge: 2014-09-28 | Disposition: A | Discharge status: Home / Self Care | Payer: Self-pay | Source: Home / Self Care | Attending: Emergency Medicine | Admitting: Emergency Medicine

## 2014-09-28 DIAGNOSIS — Z4802 Encounter for removal of sutures: Secondary | ICD-10-CM

## 2014-09-28 NOTE — ED Provider Notes (Signed)
CSN: [id number]     Arrival date & time 09/28/14  1007 History   First MD Initiated Contact with Patient 09/28/14 1024     Chief Complaint  Patient presents with  . Suture / Staple Removal   (Consider location/radiation/quality/duration/timing/severity/associated sxs/prior Treatment) HPI Comments: [facility] for suture removal [facility] Reports no issues  Patient is a [age] y.o. male presenting with suture removal. The history is provided by the patient.  Suture / Staple Removal This is a new problem.    Past Medical History  Diagnosis Date  . Distal radius fracture, left 08/2014   Past Surgical History  Procedure Laterality Date  . Wound debridement Left 07/29/2008    knee  . Femur fracture surgery Left 07/29/2008    closed treatment medial femoral epicondyle fx.  . Knee aspiration Bilateral 07/29/2008  . Open reduction internal fixation (orif) distal radial fracture Left 09/07/2014    Procedure: OPEN REDUCTION INTERNAL FIXATION (ORIF) DISTAL RADIAL FRACTURE;  Surgeon: Jodi Marbleavid A Thompson, MD;  Location: [facility];  Service: Orthopedics;  Laterality: Left;  Open treatment left distal radius fracture   Family History  Problem Relation Age of Onset  . Hypertension Mother   . Hypertension Other    History  Substance Use Topics  . Smoking status: Current Every Day Smoker -- 0.50 packs/day for 10 years    Types: Cigarettes  . Smokeless tobacco: Never Used  . Alcohol Use: Yes     Comment: occasionally    Review of Systems  All other systems reviewed and are negative.   Allergies  Shrimp  Home Medications   Prior to Admission medications   Medication Sig Start Date End Date Taking? Authorizing Provider  ibuprofen (ADVIL,MOTRIN) 200 MG tablet Take 200 mg by mouth every 6 (six) hours as needed for mild pain.    Yes Historical Provider, MD  oxyCODONE-acetaminophen (PERCOCET) 5-325 MG per tablet Take 1 tablet by mouth every 4  (four) hours as needed for moderate pain. 09/17/14  Yes Dione Boozeavid Glick, MD  amoxicillin (AMOXIL) 500 MG capsule Take 2 capsules (1,000 mg total) by mouth 2 (two) times daily. 09/17/14   Dione Boozeavid Glick, MD  oxyCODONE-acetaminophen (ROXICET) 5-325 MG per tablet Take 1-2 tablets by mouth every 4 (four) hours as needed for severe pain. 09/07/14   Jodi Marbleavid A Thompson, MD   BP 131/88 mmHg  Pulse 83  Temp(Src) 98 F (36.7 C) (Oral)  Resp 18  SpO2 100% Physical Exam  Constitutional: He is oriented to person, place, and time. He appears well-developed and well-nourished. No distress.  HENT:  Head: Normocephalic.  Eyes: Conjunctivae are normal. No scleral icterus.  Cardiovascular: Normal rate.   Pulmonary/Chest: Effort normal.  Neurological: He is alert and oriented to person, place, and time.  Skin: Skin is warm and dry.  Healing laceration left eyebrow  Psychiatric: He has a normal mood and affect. His behavior is normal.  Nursing note and vitals reviewed.   ED Course  Procedures (including critical care time) Labs Review Labs Reviewed - No data to display  Imaging Review No results found.   MDM   1. Encounter for removal of sutures     8 prolene sutures removed without incident Follow up prn  Ria ClockJennifer Lee H Tresha Muzio, PA 09/28/14 1100

## 2014-09-28 NOTE — Discharge Instructions (Signed)
Scar Minimization °You will have a scar anytime you have surgery and a cut is made in the skin or you have something removed from your skin (mole, skin cancer, cyst). Although scars are unavoidable following surgery, there are ways to minimize their appearance. °It is important to follow all the instructions you receive from your caregiver about wound care. How your wound heals will influence the appearance of your scar. If you do not follow the wound care instructions as directed, complications such as infection may occur. Wound instructions include keeping the wound clean, moist, and not letting the wound form a scab. Some people form scars that are raised and lumpy (hypertrophic) or larger than the initial wound (keloidal). °HOME CARE INSTRUCTIONS  °· Follow wound care instructions as directed. °· Keep the wound clean by washing it with soap and water. °· Keep the wound moist with provided antibiotic cream or petroleum jelly until completely healed. Moisten twice a day for about 2 weeks. °· Get stitches (sutures) taken out at the scheduled time. °· Avoid touching or manipulating your wound unless needed. Wash your hands thoroughly before and after touching your wound. °· Follow all restrictions such as limits on exercise or work. This depends on where your scar is located. °· Keep the scar protected from sunburn. Cover the scar with sunscreen/sunblock with SPF 30 or higher. °· Gently massage the scar using a circular motion to help minimize the appearance of the scar. Do this only after the wound has closed and all the sutures have been removed. °· For hypertrophic or keloidal scars, there are several ways to treat and minimize their appearance. Methods include compression therapy, intralesional corticosteroids, laser therapy, or surgery. These methods are performed by your caregiver. °Remember that the scar may appear lighter or darker than your normal skin color. This difference in color should even out with  time. °SEEK MEDICAL CARE IF:  °· You have a fever. °· You develop signs of infection such as pain, redness, pus, and warmth. °· You have questions or concerns. °Document Released: 05/03/2010 Document Revised: 02/05/2012 Document Reviewed: 05/03/2010 °ExitCare® Patient Information ©2015 [employer] LLC. This information is not intended to replace advice given to you by your health care provider. Make sure you discuss any questions you have with your health care provider. ° °Suture Removal, Care After °Refer to this sheet in the next few weeks. These instructions provide you with information on caring for yourself after your procedure. Your health care provider may also give you more specific instructions. Your treatment has been planned according to current medical practices, but problems sometimes occur. Call your health care provider if you have any problems or questions after your procedure. °WHAT TO EXPECT AFTER THE PROCEDURE °After your stitches (sutures) are removed, it is typical to have the following: °· Some discomfort and swelling in the wound area. °· Slight redness in the area. °HOME CARE INSTRUCTIONS  °· If you have skin adhesive strips over the wound area, do not take the strips off. They will fall off on their own in a few days. If the strips remain in place after 14 days, you may remove them. °· Change any bandages (dressings) at least once a day or as directed by your health care provider. If the bandage sticks, soak it off with warm, soapy water. °· Apply cream or ointment only as directed by your health care provider. If using cream or ointment, wash the area with soap and water 2 times a day to remove   all the cream or ointment. Rinse off the soap and pat the area dry with a clean towel. °· Keep the wound area dry and clean. If the bandage becomes wet or dirty, or if it develops a bad smell, change it as soon as possible. °· Continue to protect the wound from injury. °· Use sunscreen when out in the  sun. New scars become sunburned easily. °SEEK MEDICAL CARE IF: °· You have increasing redness, swelling, or pain in the wound. °· You see pus coming from the wound. °· You have a fever. °· You notice a bad smell coming from the wound or dressing. °· Your wound breaks open (edges not staying together). °Document Released: 08/08/2001 Document Revised: 09/03/2013 Document Reviewed: 06/25/2013 °ExitCare® Patient Information ©2015 [employer] LLC. This information is not intended to replace advice given to you by your health care provider. Make sure you discuss any questions you have with your health care provider. ° °

## 2014-09-28 NOTE — ED Notes (Signed)
Pt here to have sutures removed above his left eye.  Pt denies any issues or pain, unless he touches it.

## 2014-12-30 ENCOUNTER — Emergency Department (INDEPENDENT_AMBULATORY_CARE_PROVIDER_SITE_OTHER)
Admission: EM | Admit: 2014-12-30 | Discharge: 2014-12-30 | Disposition: A | Discharge status: Home / Self Care | Payer: Self-pay | Source: Home / Self Care | Attending: Emergency Medicine | Admitting: Emergency Medicine

## 2014-12-30 ENCOUNTER — Ambulatory Visit (HOSPITAL_COMMUNITY): Payer: Self-pay | Attending: Physician Assistant

## 2014-12-30 ENCOUNTER — Encounter (HOSPITAL_COMMUNITY): Payer: Self-pay | Admitting: Emergency Medicine

## 2014-12-30 DIAGNOSIS — W231XXA Caught, crushed, jammed, or pinched between stationary objects, initial encounter: Secondary | ICD-10-CM | POA: Insufficient documentation

## 2014-12-30 DIAGNOSIS — Y9289 Other specified places as the place of occurrence of the external cause: Secondary | ICD-10-CM | POA: Insufficient documentation

## 2014-12-30 DIAGNOSIS — S9782XA Crushing injury of left foot, initial encounter: Secondary | ICD-10-CM

## 2014-12-30 DIAGNOSIS — Y9389 Activity, other specified: Secondary | ICD-10-CM | POA: Insufficient documentation

## 2014-12-30 DIAGNOSIS — M79672 Pain in left foot: Secondary | ICD-10-CM | POA: Insufficient documentation

## 2014-12-30 MED ORDER — ACETAMINOPHEN 325 MG PO TABS
ORAL_TABLET | ORAL | Status: AC
  Filled 2014-12-30: qty 2

## 2014-12-30 MED ORDER — ACETAMINOPHEN 325 MG PO TABS
650.0000 mg | ORAL_TABLET | Freq: Once | ORAL | Status: AC
  Administered 2014-12-30: 650 mg via ORAL

## 2014-12-30 NOTE — ED Provider Notes (Signed)
CSN: [id number]     Arrival date & time 12/30/14  1754 History   First MD Initiated Contact with Patient 12/30/14 1834     Chief Complaint  Patient presents with  . Foot Injury   (Consider location/radiation/quality/duration/timing/severity/associated sxs/prior Treatment) HPI Comments: No previous injury PCP: none  Patient is a 34 y.o. male presenting with foot injury. The history is provided by the patient.  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 Distal radius fracture, left 08/2014   Past Surgical History  Procedure Laterality Date  . Wound debridement Left 07/29/2008    knee  . Femur fracture surgery Left 07/29/2008    closed treatment medial femoral epicondyle fx.  . Knee aspiration Bilateral 07/29/2008  . Open reduction internal fixation (orif) distal radial fracture Left 09/07/2014    Procedure: OPEN REDUCTION INTERNAL FIXATION (ORIF) DISTAL RADIAL FRACTURE;  Surgeon: Jodi Marbleavid A Thompson, MD;  Location: [facility];  Service: Orthopedics;  Laterality: Left;  Open treatment left distal radius fracture   Family History  Problem Relation Age of Onset  . Hypertension Mother   . Hypertension Other    History  Substance Use Topics  . Smoking status: Current Every Day Smoker -- 0.50 packs/day for 10 years    Types: Cigarettes  . Smokeless tobacco: Never Used  . Alcohol Use: Yes     Comment: occasionally    Review of Systems  All other systems reviewed and are negative.   Allergies  Shrimp  Home Medications   Prior to Admission medications   Medication Sig Start Date End Date Taking? Authorizing Provider  amoxicillin (AMOXIL) 500 MG capsule Take 2 capsules (1,000 mg total) by mouth 2 (two) times daily. 09/17/14   Dione Boozeavid  Glick, MD  ibuprofen (ADVIL,MOTRIN) 200 MG tablet Take 200 mg by mouth every 6 (six) hours as needed for mild pain.     Historical Provider, MD  oxyCODONE-acetaminophen (PERCOCET) 5-325 MG per tablet Take 1 tablet by mouth every 4 (four) hours as needed for moderate pain. 09/17/14   Dione Boozeavid Glick, MD  oxyCODONE-acetaminophen (ROXICET) 5-325 MG per tablet Take 1-2 tablets by mouth every 4 (four) hours as needed for severe pain. 09/07/14   Jodi Marbleavid A Thompson, MD   BP 117/82 mmHg  Pulse 102  Temp(Src) 98.5 F (36.9 C) (Oral)  Resp 16  SpO2 98% Physical Exam  Constitutional: He is oriented to person, place, and time. He appears well-developed and well-nourished. No distress.  HENT:  Head: Normocephalic and atraumatic.  Cardiovascular: Normal rate.   Pulmonary/Chest: Effort normal.  Musculoskeletal:       Left foot: There is tenderness and swelling. There is normal range of motion, normal capillary refill, no crepitus, no deformity and no laceration.       Feet:  Neurological: He is alert and oriented to person, place, and time.  Skin: Skin is warm and dry. No rash noted. No erythema.  +intact  Psychiatric: He has a normal mood and affect. His behavior is normal.  Nursing note and vitals reviewed.   ED Course  Procedures (including critical care time) Labs Review Labs Reviewed - No data to display  Imaging Review Dg Foot Complete Left  12/30/2014   CLINICAL DATA:  Foot caught between pallets at work today ; pain dorsally  EXAM: LEFT FOOT - COMPLETE 3+ VIEW  COMPARISON:  None.  FINDINGS: Frontal, oblique, and lateral views were obtained. There is no fracture or dislocation. Joint spaces appear intact. No erosive change.  IMPRESSION: No fracture or dislocation.  No appreciable arthropathy.   Electronically Signed   By: Bretta Bang M.D.   On: 12/30/2014 19:26     MDM   1. Crush injury of left foot, initial encounter    Films unremarkable for bony injury. Left foot placed in ace  wrap and post op shoe. Given ice pack and acetaminophen. Continue care at home with RICE and NSAIDs. Advised him to follow up with his orthopedist (Dr. Isla Pence) if symptoms do not improve over the next 1-2 weeks.   Ria Clock, [place] 12/30/14 [phone]
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
## 2014-12-30 NOTE — Discharge Instructions (Signed)
Films unremarkable for bony injury. Advise follow up with your orthopedist (Dr. Isla Pence. Thompson) if symptoms do not improve over the next 1-2 weeks. Ace wrap and post op shoe as needed for comfort. Ibuprofen or tylenol as directed on packaging for pain.  Crush Injury, Fingers or Toes A crush injury to the fingers or toes means the tissues have been damaged by being squeezed (compressed). There will be bleeding into the tissues and swelling. Often, blood will collect under the skin. When this happens, the skin on the finger often dies and may slough off (shed) 1 week to 10 days later. Usually, new skin is growing underneath. If the injury has been too severe and the tissue does not survive, the damaged tissue may begin to turn black over several days.  Wounds which occur because of the crushing may be stitched (sutured) shut. However, crush injuries are more likely to become infected than other injuries.These wounds may not be closed as tightly as other types of cuts to prevent infection. Nails involved are often lost. These usually grow back over several weeks.  DIAGNOSIS X-rays may be taken to see if there is any injury to the bones. TREATMENT Broken bones (fractures) may be treated with splinting, depending on the fracture. Often, no treatment is required for fractures of the last bone in the fingers or toes. HOME CARE INSTRUCTIONS   The crushed part should be raised (elevated) above the heart or center of the chest as much as possible for the first several days or as directed. This helps with pain and lessens swelling. Less swelling increases the chances that the crushed part will survive.  Put ice on the injured area.  Put ice in a plastic bag.  Place a towel between your skin and the bag.  Leave the ice on for 15-20 minutes, 03-04 times a day for the first 2 days.  Only take over-the-counter or prescription medicines for pain, discomfort, or fever as directed by your caregiver.  Use your  injured part only as directed.  Change your bandages (dressings) as directed.  Keep all follow-up appointments as directed by your caregiver. Not keeping your appointment could result in a chronic or permanent injury, pain, and disability. If there is any problem keeping the appointment, you must call to reschedule. SEEK IMMEDIATE MEDICAL CARE IF:   There is redness, swelling, or increasing pain in the wound area.  Pus is coming from the wound.  You have a fever.  You notice a bad smell coming from the wound or dressing.  The edges of the wound do not stay together after the sutures have been removed.  You are unable to move the injured finger or toe. MAKE SURE YOU:   Understand these instructions.  Will watch your condition.  Will get help right away if you are not doing well or get worse. Document Released: 11/13/2005 Document Revised: 02/05/2012 Document Reviewed: 03/31/2011 [facility] Patient Information 2015 [place], [place]. This information is not intended to replace advice given to you by your health care provider. Make sure you discuss any questions you have with your health care provider.
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
## 2015-09-11 ENCOUNTER — Encounter (HOSPITAL_COMMUNITY): Payer: Self-pay

## 2015-09-11 ENCOUNTER — Emergency Department (HOSPITAL_COMMUNITY)
Admission: EM | Admit: 2015-09-11 | Discharge: 2015-09-11 | Disposition: A | Discharge status: Home / Self Care | Payer: Self-pay | Attending: Emergency Medicine | Admitting: Emergency Medicine

## 2015-09-11 DIAGNOSIS — Z8781 Personal history of (healed) traumatic fracture: Secondary | ICD-10-CM | POA: Insufficient documentation

## 2015-09-11 DIAGNOSIS — Z720 Tobacco use: Secondary | ICD-10-CM | POA: Insufficient documentation

## 2015-09-11 DIAGNOSIS — K029 Dental caries, unspecified: Secondary | ICD-10-CM | POA: Insufficient documentation

## 2015-09-11 DIAGNOSIS — K0889 Other specified disorders of teeth and supporting structures: Secondary | ICD-10-CM | POA: Insufficient documentation

## 2015-09-11 DIAGNOSIS — Z792 Long term (current) use of antibiotics: Secondary | ICD-10-CM | POA: Insufficient documentation

## 2015-09-11 MED ORDER — IBUPROFEN 600 MG PO TABS: 600.0000 mg | ORAL_TABLET | Freq: Four times a day (QID) | ORAL | Status: DC | PRN

## 2015-09-11 MED ORDER — HYDROCODONE-ACETAMINOPHEN 5-325 MG PO TABS: 2.0000 | ORAL_TABLET | ORAL | Status: DC | PRN

## 2015-09-11 MED ORDER — AMOXICILLIN 500 MG PO CAPS: 500.0000 mg | ORAL_CAPSULE | Freq: Three times a day (TID) | ORAL | Status: DC

## 2015-09-11 NOTE — ED Provider Notes (Signed)
CSN: [id number]     Arrival date & time 09/11/15  1015 History   First [id number] Initiated Contact with Patient 09/11/15 1024     Chief Complaint  Patient presents with  . Dental Pain     (Consider location/radiation/quality/duration/timing/severity/associated sxs/prior Treatment) HPI   [name] [name] 34 y.o. male   Dental Pain Onset: This past Monday, 6 days ago Location:  Right lower second molar Quality:  aching Severity:  Severe 9/10 Progression:  Waxing and waning Chronicity:  reoccuring Context: cool air from the freezer at work Relieved by:  nothing Worsened by: Eating Ineffective treatments: Tylenol Associated symptoms: pain Risk factors: poor dental care  Negative ROS: Nausea, vomiting, diarrhea,  Fevers, myalgias, weakness, confusion, neck pain, rash, SOB, diaphoresis,ear pain, sore throat, trouble swallowing, drooling, difficulty opening jaw.     Past Medical History  Diagnosis Date  . Distal radius fracture, left 08/2014   Past Surgical History  Procedure Laterality Date  . Wound debridement Left 07/29/2008    knee  . Femur fracture surgery Left 07/29/2008    closed treatment medial femoral epicondyle fx.  . Knee aspiration Bilateral 07/29/2008  . Open reduction internal fixation (orif) distal radial fracture Left 09/07/2014    Procedure: OPEN REDUCTION INTERNAL FIXATION (ORIF) DISTAL RADIAL FRACTURE;  Surgeon: Jodi Marbleavid A Thompson, MD;  Location: [facility];  Service: Orthopedics;  Laterality: Left;  Open treatment left distal radius fracture   Family History  Problem Relation Age of Onset  . Hypertension Mother   . Hypertension Other    Social History  Substance Use Topics  . Smoking status: Current Every Day Smoker -- 0.50 packs/day for 10 years    Types: Cigarettes  . Smokeless tobacco: Never Used  . Alcohol Use: Yes     Comment: occasionally    Review of Systems  10 Systems reviewed and are negative for acute change except as noted in  the HPI.   Allergies  Shrimp  Home Medications   Prior to Admission medications   Medication Sig Start Date End Date Taking? Authorizing Provider  amoxicillin (AMOXIL) 500 MG capsule Take 2 capsules (1,000 mg total) by mouth 2 (two) times daily. 09/17/14   Dione Boozeavid Glick, MD  amoxicillin (AMOXIL) 500 MG capsule Take 1 capsule (500 mg total) by mouth 3 (three) times daily. 09/11/15   Marlon Peliffany Korrine Sicard, PA-C  HYDROcodone-acetaminophen (NORCO/VICODIN) 5-325 MG tablet Take 2 tablets by mouth every 4 (four) hours as needed. 09/11/15   Abdallah Hern Neva SeatGreene, PA-C  ibuprofen (ADVIL,MOTRIN) 200 MG tablet Take 200 mg by mouth every 6 (six) hours as needed for mild pain.     Historical Provider, MD  ibuprofen (ADVIL,MOTRIN) 600 MG tablet Take 1 tablet (600 mg total) by mouth every 6 (six) hours as needed. 09/11/15   Myking Sar Neva SeatGreene, PA-C  oxyCODONE-acetaminophen (PERCOCET) 5-325 MG per tablet Take 1 tablet by mouth every 4 (four) hours as needed for moderate pain. 09/17/14   Dione Boozeavid Glick, MD  oxyCODONE-acetaminophen (ROXICET) 5-325 MG per tablet Take 1-2 tablets by mouth every 4 (four) hours as needed for severe pain. 09/07/14   Mack Hookavid Thompson, MD   BP 142/86 mmHg  Pulse 90  Temp(Src) 98.6 F (37 C) (Oral)  Resp 16  SpO2 98% Physical Exam  Constitutional: He appears well-developed and well-nourished.  HENT:  Head: Normocephalic and atraumatic.  Mouth/Throat: Oropharynx is clear and moist and mucous membranes are normal. He does not have dentures. No oral lesions. No trismus in the jaw. Normal dentition. Dental  caries present. No dental abscesses, uvula swelling or lacerations.    No drooling or tongue protrusion, no trismus  Eyes: Conjunctivae and EOM are normal. Pupils are equal, round, and reactive to light.  Neck: Normal range of motion. Neck supple.  Cardiovascular: Normal rate and regular rhythm.   Pulmonary/Chest: Effort normal and breath sounds normal.  Nursing note and vitals reviewed.   ED  Course  Procedures (including critical care time) Labs Review Labs Reviewed - No data to display  Imaging Review No results found. I have personally reviewed and evaluated these images and lab results as part of my medical decision-making.   EKG Interpretation None      MDM   Final diagnoses:  Pain, dental    Pt declined dental block that was offered. Given print out dental resources.  Medications - No data to display  34 y.o.[name] [name]'s evaluation in the Emergency Department is complete.  We have discussed signs and symptoms that warrant return to the ED, such as changes or worsening in symptoms. No emergent s/sx's present. Patent airway. No trismus.  No neck tenderness or protrusion of tongue or floor of mouth. Patient will be given an rx for Amoxicillin, short course of norco and Ibuprofen. He will be referred to a dentist with instructions for follow-up.  Vital signs are stable at discharge. Filed Vitals:   09/11/15 1022  BP: 142/86  Pulse: 90  Temp: 98.6 F (37 C)  Resp: 16    Patient/guardian has voiced understanding and agreed to follow-up with the PCP or specialist.      Marlon Pel, PA-C 09/11/15 1045  Richardean Canal, MD 09/11/15 [phone]

## 2015-09-11 NOTE — ED Notes (Signed)
PT states he is having 9/10 dental pain in the right lower part of his mouth. Reports bleeding when brushing his teeth. Back second to last molar on the right side appears broken off to this nurse. PT took tylenol last night for the pain with no relief.

## 2015-09-11 NOTE — Discharge Instructions (Signed)

## 2017-03-26 ENCOUNTER — Telehealth (HOSPITAL_COMMUNITY): Payer: Self-pay | Admitting: Emergency Medicine

## 2017-03-26 ENCOUNTER — Ambulatory Visit (HOSPITAL_COMMUNITY)
Admission: EM | Admit: 2017-03-26 | Discharge: 2017-03-26 | Disposition: A | Discharge status: Home / Self Care | Payer: Self-pay | Attending: Internal Medicine | Admitting: Internal Medicine

## 2017-03-26 ENCOUNTER — Ambulatory Visit (INDEPENDENT_AMBULATORY_CARE_PROVIDER_SITE_OTHER): Payer: Self-pay

## 2017-03-26 ENCOUNTER — Encounter (HOSPITAL_COMMUNITY): Payer: Self-pay | Admitting: Emergency Medicine

## 2017-03-26 DIAGNOSIS — S8000XS Contusion of unspecified knee, sequela: Secondary | ICD-10-CM

## 2017-03-26 DIAGNOSIS — M25562 Pain in left knee: Secondary | ICD-10-CM

## 2017-03-26 MED ORDER — NAPROXEN 500 MG PO TABS: 500.0000 mg | ORAL_TABLET | Freq: Two times a day (BID) | ORAL | 0 refills | Status: DC

## 2017-03-26 NOTE — Telephone Encounter (Signed)
Patient returned for work form to be signed.  Form titled "authorization to begin/return to work for non-work related injury or illness"  Charity fundraiser, np signed form.  Copied to be scanned into medical record

## 2017-03-26 NOTE — ED Triage Notes (Signed)
Left knee pain started 2 days ago.  Hit his knee on the corner of the bed.  Left knee is the knee he has had surgery on in the past

## 2017-03-26 NOTE — ED Provider Notes (Signed)
CSN: [id number]     Arrival date & time 03/26/17  1213 History   First MD Initiated Contact with Patient 03/26/17 1339     Chief Complaint  Patient presents with  . Knee Pain   (Consider location/radiation/quality/duration/timing/severity/associated sxs/prior Treatment) Patient c/o left knee pain after hitting left knee on corner of bed a few days ago.   The history is provided by the patient.  Knee Pain  Location:  Knee Time since incident:  2 days Injury: yes   Knee location:  L knee Pain details:    Quality:  Aching   Radiates to:  Does not radiate   Severity:  Moderate   Onset quality:  Sudden   Duration:  2 days   Timing:  Constant Chronicity:  New Dislocation: no   Foreign body present:  Unable to specify Tetanus status:  Unknown Prior injury to area:  Yes Relieved by:  None tried Worsened by:  Bearing weight Ineffective treatments:  None tried   Past Medical History:  Diagnosis Date  . Distal radius fracture, left 08/2014   Past Surgical History:  Procedure Laterality Date  . FEMUR FRACTURE SURGERY Left 07/29/2008   closed treatment medial femoral epicondyle fx.  [name] KNEE ASPIRATION Bilateral 07/29/2008  . OPEN REDUCTION INTERNAL FIXATION (ORIF) DISTAL RADIAL FRACTURE Left 09/07/2014   Procedure: OPEN REDUCTION INTERNAL FIXATION (ORIF) DISTAL RADIAL FRACTURE;  Surgeon: Jodi Marble, MD;  Location: [facility];  Service: Orthopedics;  Laterality: Left;  Open treatment left distal radius fracture  . WOUND DEBRIDEMENT Left 07/29/2008   knee   Family History  Problem Relation Age of Onset  . Hypertension Mother   . Hypertension Other    Social History  Substance Use Topics  . Smoking status: Current Every Day Smoker    Packs/day: 0.50    Years: 10.00    Types: Cigarettes  . Smokeless tobacco: Never Used  . Alcohol use Yes     Comment: occasionally    Review of Systems  Constitutional: Negative.   HENT: Negative.   Eyes: Negative.    Respiratory: Negative.   Cardiovascular: Negative.   Gastrointestinal: Negative.   Endocrine: Negative.   Genitourinary: Negative.   Musculoskeletal: Positive for arthralgias.  Skin: Negative.   Allergic/Immunologic: Negative.   Neurological: Negative.   Hematological: Negative.   Psychiatric/Behavioral: Negative.     Allergies  Shrimp [shellfish allergy]  Home Medications   Prior to Admission medications   Medication Sig Start Date End Date Taking? Authorizing Provider  amoxicillin (AMOXIL) 500 MG capsule Take 2 capsules (1,000 mg total) by mouth 2 (two) times daily. 09/17/14   Dione Booze, MD  amoxicillin (AMOXIL) 500 MG capsule Take 1 capsule (500 mg total) by mouth 3 (three) times daily. 09/11/15   Marlon Pel, PA-C  HYDROcodone-acetaminophen (NORCO/VICODIN) 5-325 MG tablet Take 2 tablets by mouth every 4 (four) hours as needed. 09/11/15   Tiffany Neva Seat, PA-C  ibuprofen (ADVIL,MOTRIN) 200 MG tablet Take 200 mg by mouth every 6 (six) hours as needed for mild pain.     Historical Provider, MD  ibuprofen (ADVIL,MOTRIN) 600 MG tablet Take 1 tablet (600 mg total) by mouth every 6 (six) hours as needed. 09/11/15   Tiffany Neva Seat, PA-C  naproxen (NAPROSYN) 500 MG tablet Take 1 tablet (500 mg total) by mouth 2 (two) times daily with a meal. 03/26/17   Deatra Canter, FNP  oxyCODONE-acetaminophen (PERCOCET) 5-325 MG per tablet Take 1 tablet by mouth every 4 (four) hours as  needed for moderate pain. 09/17/14   Dione Booze, MD  oxyCODONE-acetaminophen (ROXICET) 5-325 MG per tablet Take 1-2 tablets by mouth every 4 (four) hours as needed for severe pain. 09/07/14   Mack Hook, MD   Meds Ordered and Administered this Visit  Medications - No data to display  BP (!) 144/92 (BP Location: Right Arm) Comment: notified rn  Pulse 91   Temp 98.7 F (37.1 C) (Oral)   Resp 16   SpO2 99%  No data found.   Physical Exam  Constitutional: He appears well-developed and well-nourished.   HENT:  Head: Normocephalic and atraumatic.  Eyes: Conjunctivae and EOM are normal. Pupils are equal, round, and reactive to light.  Neck: Normal range of motion. Neck supple.  Cardiovascular: Normal rate, regular rhythm and normal heart sounds.   Pulmonary/Chest: Effort normal and breath sounds normal.  Musculoskeletal: He exhibits tenderness.  TTP left knee and mild swelling medial knee.  Nursing note and vitals reviewed.   Urgent Care Course     Procedures (including critical care time)  Labs Review Labs Reviewed - No data to display  Imaging Review Dg Knee Complete 4 Views Left  Result Date: 03/26/2017 CLINICAL DATA:  Initial encounter for Patient states that he hit his left knee hard against bed post x 3 days ago, continued pain. Hx of left knee surgery. EXAM: LEFT KNEE - COMPLETE 4+ VIEW COMPARISON:  07/29/2008 FINDINGS: Remote trauma involving the medial collateral ligament with resultant calcification. Healed fracture of the medial supracondylar femur. No acute fracture or dislocation. Small suprapatellar joint effusion. IMPRESSION: Small suprapatellar joint effusion, without acute osseous finding. Electronically Signed   By: Jeronimo Greaves M.D.   On: 03/26/2017 14:05     Visual Acuity Review  Right Eye Distance:   Left Eye Distance:   Bilateral Distance:    Right Eye Near:   Left Eye Near:    Bilateral Near:         MDM   1. Acute pain of left knee   2. Contusion of knee, unspecified laterality, sequela     Naprosyn  one po bid x 10 days #20  Note for work     Deatra Canter, FNP 03/26/17 1520

## 2018-01-12 ENCOUNTER — Emergency Department (HOSPITAL_COMMUNITY): Payer: Self-pay

## 2018-01-12 ENCOUNTER — Encounter (HOSPITAL_COMMUNITY): Payer: Self-pay

## 2018-01-12 ENCOUNTER — Emergency Department (HOSPITAL_COMMUNITY)
Admission: EM | Admit: 2018-01-12 | Discharge: 2018-01-12 | Disposition: A | Discharge status: Home / Self Care | Payer: Self-pay | Attending: Emergency Medicine | Admitting: Emergency Medicine

## 2018-01-12 ENCOUNTER — Other Ambulatory Visit: Payer: Self-pay

## 2018-01-12 DIAGNOSIS — Y999 Unspecified external cause status: Secondary | ICD-10-CM | POA: Insufficient documentation

## 2018-01-12 DIAGNOSIS — W503XXA Accidental bite by another person, initial encounter: Secondary | ICD-10-CM

## 2018-01-12 DIAGNOSIS — F17210 Nicotine dependence, cigarettes, uncomplicated: Secondary | ICD-10-CM | POA: Insufficient documentation

## 2018-01-12 DIAGNOSIS — Y939 Activity, unspecified: Secondary | ICD-10-CM | POA: Insufficient documentation

## 2018-01-12 DIAGNOSIS — S61259A Open bite of unspecified finger without damage to nail, initial encounter: Secondary | ICD-10-CM

## 2018-01-12 DIAGNOSIS — Z23 Encounter for immunization: Secondary | ICD-10-CM | POA: Insufficient documentation

## 2018-01-12 DIAGNOSIS — S61256A Open bite of right little finger without damage to nail, initial encounter: Secondary | ICD-10-CM | POA: Insufficient documentation

## 2018-01-12 DIAGNOSIS — Y929 Unspecified place or not applicable: Secondary | ICD-10-CM | POA: Insufficient documentation

## 2018-01-12 MED ORDER — AMOXICILLIN-POT CLAVULANATE 875-125 MG PO TABS: 1.0000 | ORAL_TABLET | Freq: Two times a day (BID) | ORAL | 0 refills | Status: DC

## 2018-01-12 MED ORDER — SODIUM CHLORIDE 0.9 % IV BOLUS (SEPSIS): 1000.0000 mL | Freq: Once | INTRAVENOUS | Status: DC

## 2018-01-12 MED ORDER — TETANUS-DIPHTH-ACELL PERTUSSIS 5-2.5-18.5 LF-MCG/0.5 IM SUSP
0.5000 mL | Freq: Once | INTRAMUSCULAR | Status: AC
  Administered 2018-01-12: 0.5 mL via INTRAMUSCULAR
  Filled 2018-01-12: qty 0.5

## 2018-01-12 NOTE — ED Triage Notes (Signed)
Patient complains of pain to right hand little finger last night, reports pain to same, NAD

## 2018-01-12 NOTE — ED Notes (Signed)
Pt's hand soaking in NS per PA

## 2018-01-12 NOTE — Discharge Instructions (Signed)
Return here Monday morning for a reassessment of the area.  Do not have anything to eat after midnight Sunday night until after your evaluated here in the emergency department.  Keep the area clean and dry you can soak in warm water and antibacterial soap.  Return here in the interim for any worsening in the area such as increased swelling redness drainage or warmth.

## 2018-01-12 NOTE — ED Notes (Signed)
Declined W/C at D/C and was escorted to lobby by RN. 
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
## 2018-01-14 ENCOUNTER — Other Ambulatory Visit: Payer: Self-pay

## 2018-01-14 ENCOUNTER — Encounter (HOSPITAL_COMMUNITY): Payer: Self-pay | Admitting: Emergency Medicine

## 2018-01-14 ENCOUNTER — Emergency Department (HOSPITAL_COMMUNITY)
Admission: EM | Admit: 2018-01-14 | Discharge: 2018-01-14 | Disposition: A | Discharge status: Home / Self Care | Payer: Self-pay | Attending: Emergency Medicine | Admitting: Emergency Medicine

## 2018-01-14 DIAGNOSIS — Z79899 Other long term (current) drug therapy: Secondary | ICD-10-CM | POA: Insufficient documentation

## 2018-01-14 DIAGNOSIS — W503XXD Accidental bite by another person, subsequent encounter: Secondary | ICD-10-CM | POA: Insufficient documentation

## 2018-01-14 DIAGNOSIS — F17210 Nicotine dependence, cigarettes, uncomplicated: Secondary | ICD-10-CM | POA: Insufficient documentation

## 2018-01-14 DIAGNOSIS — Z5189 Encounter for other specified aftercare: Secondary | ICD-10-CM | POA: Insufficient documentation

## 2018-01-14 DIAGNOSIS — S61259D Open bite of unspecified finger without damage to nail, subsequent encounter: Secondary | ICD-10-CM | POA: Insufficient documentation

## 2018-01-14 DIAGNOSIS — Z91013 Allergy to seafood: Secondary | ICD-10-CM | POA: Insufficient documentation
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
## 2018-01-14 NOTE — Discharge Instructions (Signed)
Please complete entire course of antibiotics, wound appears to be healing well.  If you develop redness, swelling, drainage or increasing pain please return to the ED for reevaluation otherwise please follow-up with [facility] for primary care as needed.
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
## 2018-01-30 ENCOUNTER — Ambulatory Visit (HOSPITAL_COMMUNITY)
Admission: EM | Admit: 2018-01-30 | Discharge: 2018-01-30 | Disposition: A | Discharge status: Home / Self Care | Payer: Self-pay | Attending: Family Medicine | Admitting: Family Medicine

## 2018-01-30 ENCOUNTER — Encounter (HOSPITAL_COMMUNITY): Payer: Self-pay | Admitting: Emergency Medicine

## 2018-01-30 DIAGNOSIS — K0889 Other specified disorders of teeth and supporting structures: Secondary | ICD-10-CM

## 2018-01-30 MED ORDER — PENICILLIN V POTASSIUM 500 MG PO TABS: 500.0000 mg | ORAL_TABLET | Freq: Four times a day (QID) | ORAL | 0 refills | Status: AC

## 2018-01-30 MED ORDER — MELOXICAM 7.5 MG PO TABS: 7.5000 mg | ORAL_TABLET | Freq: Every day | ORAL | 0 refills | Status: DC

## 2018-01-30 MED ORDER — BENZOCAINE 7.5 % MT GEL: Freq: Three times a day (TID) | OROMUCOSAL | 0 refills | Status: DC | PRN

## 2018-01-30 NOTE — Discharge Instructions (Signed)
Start Penicillin as directed for dental abscess. Mobic and orajel for pain. Follow up with dentist for further treatment and evaluation. If experiencing swelling of the throat, trouble breathing, trouble swallowing, follow up for reevaluation.

## 2018-01-30 NOTE — ED Triage Notes (Signed)
PT reports right sided dental pain for 4 days.
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
## 2018-03-06 ENCOUNTER — Other Ambulatory Visit: Payer: Self-pay

## 2018-03-06 ENCOUNTER — Encounter (HOSPITAL_COMMUNITY): Payer: Self-pay | Admitting: Emergency Medicine

## 2018-03-06 ENCOUNTER — Ambulatory Visit (HOSPITAL_COMMUNITY)
Admission: EM | Admit: 2018-03-06 | Discharge: 2018-03-06 | Disposition: A | Discharge status: Home / Self Care | Payer: Self-pay | Attending: Urgent Care | Admitting: Urgent Care

## 2018-03-06 DIAGNOSIS — R519 Headache, unspecified: Secondary | ICD-10-CM

## 2018-03-06 DIAGNOSIS — K0889 Other specified disorders of teeth and supporting structures: Secondary | ICD-10-CM

## 2018-03-06 DIAGNOSIS — R51 Headache: Secondary | ICD-10-CM

## 2018-03-06 DIAGNOSIS — K047 Periapical abscess without sinus: Secondary | ICD-10-CM

## 2018-03-06 MED ORDER — AMOXICILLIN-POT CLAVULANATE 875-125 MG PO TABS: 1.0000 | ORAL_TABLET | Freq: Two times a day (BID) | ORAL | 0 refills | Status: DC
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
## 2018-03-06 NOTE — Discharge Instructions (Signed)
You need to contact a dental practice for an urgent consult on your dental infection. You may take 500mg  Tylenol with ibuprofen 600mg  every 6 hours for pain and inflammation.

## 2018-03-06 NOTE — ED Triage Notes (Signed)
Intermittent dental pain.  For the past 2 days, pain has been more consistent .  Right, bottom tooth is hurting

## 2018-03-22 ENCOUNTER — Encounter (HOSPITAL_COMMUNITY): Payer: Self-pay | Admitting: *Deleted

## 2018-03-22 ENCOUNTER — Ambulatory Visit (HOSPITAL_COMMUNITY)
Admission: EM | Admit: 2018-03-22 | Discharge: 2018-03-22 | Disposition: A | Discharge status: Home / Self Care | Payer: Self-pay | Attending: Family Medicine | Admitting: Family Medicine

## 2018-03-22 DIAGNOSIS — Z041 Encounter for examination and observation following transport accident: Secondary | ICD-10-CM

## 2018-03-22 DIAGNOSIS — Z0289 Encounter for other administrative examinations: Secondary | ICD-10-CM
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
## 2018-04-18 ENCOUNTER — Ambulatory Visit (HOSPITAL_COMMUNITY)
Admission: EM | Admit: 2018-04-18 | Discharge: 2018-04-18 | Disposition: A | Discharge status: Home / Self Care | Payer: Self-pay | Attending: Family Medicine | Admitting: Family Medicine

## 2018-04-18 ENCOUNTER — Encounter (HOSPITAL_COMMUNITY): Payer: Self-pay | Admitting: Emergency Medicine

## 2018-04-18 ENCOUNTER — Other Ambulatory Visit: Payer: Self-pay

## 2018-04-18 DIAGNOSIS — R51 Headache: Secondary | ICD-10-CM

## 2018-04-18 DIAGNOSIS — R519 Headache, unspecified: Secondary | ICD-10-CM

## 2018-04-18 MED ORDER — DEXAMETHASONE SODIUM PHOSPHATE 10 MG/ML IJ SOLN
INTRAMUSCULAR | Status: AC
  Filled 2018-04-18: qty 1

## 2018-04-18 MED ORDER — FLUTICASONE PROPIONATE 50 MCG/ACT NA SUSP: 1.0000 | Freq: Every day | NASAL | 2 refills | Status: DC

## 2018-04-18 MED ORDER — KETOROLAC TROMETHAMINE 60 MG/2ML IM SOLN
INTRAMUSCULAR | Status: AC
  Filled 2018-04-18: qty 2

## 2018-04-18 MED ORDER — KETOROLAC TROMETHAMINE 60 MG/2ML IM SOLN
60.0000 mg | Freq: Once | INTRAMUSCULAR | Status: AC
  Administered 2018-04-18: 60 mg via INTRAMUSCULAR

## 2018-04-18 MED ORDER — DEXAMETHASONE SODIUM PHOSPHATE 10 MG/ML IJ SOLN
10.0000 mg | Freq: Once | INTRAMUSCULAR | Status: AC
Start: 2018-04-18 — End: 2018-04-18
  Administered 2018-04-18: 10 mg via INTRAMUSCULAR

## 2018-04-18 MED ORDER — IBUPROFEN 800 MG PO TABS: 800.0000 mg | ORAL_TABLET | Freq: Three times a day (TID) | ORAL | 0 refills | Status: DC

## 2018-04-18 NOTE — ED Provider Notes (Signed)
[facility]    CSN: [id number] Arrival date & time: 04/18/18  1544     History   Chief Complaint Chief Complaint  Patient presents with  . Headache    HPI [name] [name] is a 37 y.o. male.   [name] [name] presents with complaints of frontal headache which is throbbing as well as cough and congestion. This has been ongoing for the past 4 days. No vision change. No light or sound sensitivity. No dizziness. Pain does worsen with movement. No nausea or vomiting. Pain 8/10. Has not taken any medications for symptoms. No shortness of breath  Or chest pain . Has had similar headache in the past but never for as long. Denies ear pain or sore throat. No known fevers. Without contributing medical history.      ROS per HPI.      Past Medical History:  Diagnosis Date  . Distal radius fracture, left 08/2014    There are no active problems to display for this patient.   Past Surgical History:  Procedure Laterality Date  . FEMUR FRACTURE SURGERY Left 07/29/2008   closed treatment medial femoral epicondyle fx.  [name] KNEE ASPIRATION Bilateral 07/29/2008  . OPEN REDUCTION INTERNAL FIXATION (ORIF) DISTAL RADIAL FRACTURE Left 09/07/2014   Procedure: OPEN REDUCTION INTERNAL FIXATION (ORIF) DISTAL RADIAL FRACTURE;  Surgeon: Jodi Marble, MD;  Location: [facility];  Service: Orthopedics;  Laterality: Left;  Open treatment left distal radius fracture  . WOUND DEBRIDEMENT Left 07/29/2008   knee       Home Medications    Prior to Admission medications   Medication Sig Start Date End Date Taking? Authorizing Provider  fluticasone (FLONASE) 50 MCG/ACT nasal spray Place 1 spray into both nostrils daily. 04/18/18   Georgetta Haber, NP  ibuprofen (ADVIL,MOTRIN) 800 MG tablet Take 1 tablet (800 mg total) by mouth 3 (three) times daily. 04/18/18   Georgetta Haber, NP    Family History Family History  Problem Relation Age of Onset  . Hypertension Mother   .  Hypertension Other     Social History Social History   Tobacco Use  . Smoking status: Current Every Day Smoker    Packs/day: 0.50    Years: 10.00    Pack years: 5.00    Types: Cigarettes  . Smokeless tobacco: Never Used  Substance Use Topics  . Alcohol use: Yes    Comment: occasionally  . Drug use: No     Allergies   Shrimp [shellfish allergy]   Review of Systems Review of Systems   Physical Exam Triage Vital Signs ED Triage Vitals  Enc Vitals Group     BP 04/18/18 1604 131/83     Pulse Rate 04/18/18 1604 97     Resp 04/18/18 1604 18     Temp 04/18/18 1604 99.6 F (37.6 C)     Temp Source 04/18/18 1604 Oral     SpO2 04/18/18 1604 100 %     Weight --      Height --      Head Circumference --      Peak Flow --      Pain Score 04/18/18 1603 9     Pain Loc --      Pain Edu? --      Excl. in GC? --    No data found.  Updated Vital Signs BP 131/83 (BP Location: Left Arm)   Pulse 97   Temp 99.6 F (37.6 C) (Oral)  Resp 18   SpO2 100%   Visual Acuity Right Eye Distance:   Left Eye Distance:   Bilateral Distance:    Right Eye Near:   Left Eye Near:    Bilateral Near:     Physical Exam  Constitutional: He is oriented to person, place, and time. He appears well-developed and well-nourished.  HENT:  Head: Normocephalic and atraumatic.  Right Ear: Tympanic membrane, external ear and ear canal normal.  Left Ear: Tympanic membrane, external ear and ear canal normal.  Nose: Nose normal. Right sinus exhibits no maxillary sinus tenderness. Left sinus exhibits no maxillary sinus tenderness.  Mouth/Throat: Uvula is midline, oropharynx is clear and moist and mucous membranes are normal.  Very mild frontal sinus tenderness   Eyes: Pupils are equal, round, and reactive to light. Conjunctivae are normal.  Neck: Normal range of motion.  Cardiovascular: Normal rate and regular rhythm.  Pulmonary/Chest: Effort normal and breath sounds normal.  Occasional  congested cough noted  Lymphadenopathy:    He has no cervical adenopathy.  Neurological: He is alert and oriented to person, place, and time. He has normal strength. He is not disoriented. GCS eye subscore is 4. GCS verbal subscore is 5. GCS motor subscore is 6.  Skin: Skin is warm and dry.  Psychiatric: He has a normal mood and affect.  Vitals reviewed.    UC Treatments / Results  Labs (all labs ordered are listed, but only abnormal results are displayed) Labs Reviewed - No data to display  EKG None  Radiology No results found.  Procedures Procedures (including critical care time)  Medications Ordered in UC Medications  ketorolac (TORADOL) injection 60 mg (has no administration in time range)  dexamethasone (DECADRON) injection 10 mg (has no administration in time range)    Initial Impression / Assessment and Plan / UC Course  I have reviewed the triage vital signs and the nursing notes.  Pertinent labs & imaging results that were available during my care of the patient were reviewed by me and considered in my medical decision making (see chart for details).     Non toxic in appearance. Without redflag findings on exam. History and physical consistent with viral illness causing headache which has gone untreated. toradol and decadron in clinic today. Encouraged daily flonase, push fluids. Return precautions provided. Patient verbalized understanding and agreeable to plan.    Final Clinical Impressions(s) / UC Diagnoses   Final diagnoses:  Sinus headache     Discharge Instructions     Push fluids to ensure adequate hydration and keep secretions thin.  Tylenol and/or ibuprofen as needed for pain or fevers.  May take next dose of ibuprofen after 11p tonight if needed. Use of daily flonase. May start daily zyrtec which may also help with symptoms.  If symptoms worsen or do not improve in the next week to return to be seen or establish with a primary care provider.     ED Prescriptions    Medication Sig Dispense Auth. Provider   fluticasone (FLONASE) 50 MCG/ACT nasal spray Place 1 spray into both nostrils daily. 16 g Linus Mako B, NP   ibuprofen (ADVIL,MOTRIN) 800 MG tablet Take 1 tablet (800 mg total) by mouth 3 (three) times daily. 21 tablet Georgetta Haber, NP     Controlled Substance Prescriptions [place] Controlled Substance Registry consulted? Not Applicable   Georgetta Haber, NP 04/18/18 [phone]

## 2018-04-18 NOTE — ED Triage Notes (Signed)
Headache since Monday.  Reports blood pressure registered high at [facility].  Had a cold 2 weeks ago.

## 2018-04-18 NOTE — Discharge Instructions (Signed)
Push fluids to ensure adequate hydration and keep secretions thin.  Tylenol and/or ibuprofen as needed for pain or fevers.  May take next dose of ibuprofen after 11p tonight if needed. Use of daily flonase. May start daily zyrtec which may also help with symptoms.  If symptoms worsen or do not improve in the next week to return to be seen or establish with a primary care provider.

## 2018-06-12 ENCOUNTER — Ambulatory Visit (HOSPITAL_COMMUNITY)
Admission: EM | Admit: 2018-06-12 | Discharge: 2018-06-12 | Disposition: A | Discharge status: Home / Self Care | Payer: PRIVATE HEALTH INSURANCE | Attending: Internal Medicine | Admitting: Internal Medicine

## 2018-06-12 ENCOUNTER — Encounter (HOSPITAL_COMMUNITY): Payer: Self-pay | Admitting: Emergency Medicine

## 2018-06-12 DIAGNOSIS — K047 Periapical abscess without sinus: Secondary | ICD-10-CM

## 2018-06-12 DIAGNOSIS — K0889 Other specified disorders of teeth and supporting structures: Secondary | ICD-10-CM | POA: Diagnosis not present

## 2018-06-12 MED ORDER — NAPROXEN 500 MG PO TABS: 500.0000 mg | ORAL_TABLET | Freq: Two times a day (BID) | ORAL | 0 refills | Status: DC

## 2018-06-12 MED ORDER — LIDOCAINE VISCOUS HCL 2 % MT SOLN: 15.0000 mL | OROMUCOSAL | 0 refills | Status: DC | PRN

## 2018-06-12 MED ORDER — AMOXICILLIN-POT CLAVULANATE 875-125 MG PO TABS: 1.0000 | ORAL_TABLET | Freq: Two times a day (BID) | ORAL | 0 refills | Status: DC

## 2018-06-12 NOTE — ED Provider Notes (Signed)
[facility]   [id number] 06/12/18 Arrival Time: 1707  SUBJECTIVE:  Eulogio BearRayshawn D Rathman is a 37 y.o. male who reports abrupt onset of right lower dental pain described as achy. Present for 3-4 days. Afebrile. Tolerating PO intake but reports pain with chewing. Normal swallowing. He does not see a dentist regularly. No neck swelling or pain. OTC analgesics without relief.  ROS: As per HPI.  Past Medical History:  Diagnosis Date  . Distal radius fracture, left 08/2014   Past Surgical History:  Procedure Laterality Date  . FEMUR FRACTURE SURGERY Left 07/29/2008   closed treatment medial femoral epicondyle fx.  [name]. KNEE ASPIRATION Bilateral 07/29/2008  . OPEN REDUCTION INTERNAL FIXATION (ORIF) DISTAL RADIAL FRACTURE Left 09/07/2014   Procedure: OPEN REDUCTION INTERNAL FIXATION (ORIF) DISTAL RADIAL FRACTURE;  Surgeon: Jodi Marbleavid A Thompson, MD;  Location: [facility];  Service: Orthopedics;  Laterality: Left;  Open treatment left distal radius fracture  . WOUND DEBRIDEMENT Left 07/29/2008   knee   Allergies  Allergen Reactions  . Shrimp [Shellfish Allergy] Itching and Swelling    OF THROAT - DENIES SOB   No current facility-administered medications on file prior to encounter.    Current Outpatient Medications on File Prior to Encounter  Medication Sig Dispense Refill  . fluticasone (FLONASE) 50 MCG/ACT nasal spray Place 1 spray into both nostrils daily. (Patient not taking: Reported on 06/12/2018) 16 g 2  . ibuprofen (ADVIL,MOTRIN) 800 MG tablet Take 1 tablet (800 mg total) by mouth 3 (three) times daily. 21 tablet 0   Social History   Socioeconomic History  . Marital status: Single    Spouse name: Not on file  . Number of children: Not on file  . Years of education: Not on file  . Highest education level: Not on file  Occupational History  . Not on file  Social Needs  . Financial resource strain: Not on file  . Food insecurity:    Worry: Not on file   Inability: Not on file  . Transportation needs:    Medical: Not on file    Non-medical: Not on file  Tobacco Use  . Smoking status: Current Every Day Smoker    Packs/day: 0.50    Years: 10.00    Pack years: 5.00    Types: Cigarettes  . Smokeless tobacco: Never Used  Substance and Sexual Activity  . Alcohol use: Yes    Comment: occasionally  . Drug use: No  . Sexual activity: Yes    Birth control/protection: Condom  Lifestyle  . Physical activity:    Days per week: Not on file    Minutes per session: Not on file  . Stress: Not on file  Relationships  . Social connections:    Talks on phone: Not on file    Gets together: Not on file    Attends religious service: Not on file    Active member of club or organization: Not on file    Attends meetings of clubs or organizations: Not on file    Relationship status: Not on file  . Intimate partner violence:    Fear of current or ex partner: Not on file    Emotionally abused: Not on file    Physically abused: Not on file    Forced sexual activity: Not on file  Other Topics Concern  . Not on file  Social History Narrative  . Not on file   Family History  Problem Relation Age of Onset  . Hypertension  Mother   . Hypertension Other     OBJECTIVE:  Vitals:   06/12/18 1741  BP: 126/86  Pulse: 87  Resp: 18  Temp: 98.3 F (36.8 C)  SpO2: 100%    General appearance: alert; no distress HENT: normocephalic; atraumatic; dentition: poor; poor dentition over right lower gums without areas of fluctuance Neck: supple without LAD Lungs: normal respirations Skin: warm and dry Psychological: alert and cooperative; normal mood and affect  ASSESSMENT & PLAN:  1. Pain, dental   2. Dental infection     Meds ordered this encounter  Medications  . naproxen (NAPROSYN) 500 MG tablet    Sig: Take 1 tablet (500 mg total) by mouth 2 (two) times daily.    Dispense:  30 tablet    Refill:  0    Order Specific Question:   Supervising  Provider    Answer:   Isa Rankin [id number]  . lidocaine (XYLOCAINE) 2 % solution    Sig: Use as directed 15 mLs in the mouth or throat as needed for mouth pain.    Dispense:  100 mL    Refill:  0    Order Specific Question:   Supervising Provider    Answer:   Isa Rankin [[id number]]  . amoxicillin-clavulanate (AUGMENTIN) 875-125 MG tablet    Sig: Take 1 tablet by mouth every 12 (twelve) hours.    Dispense:  14 tablet    Refill:  0    Order Specific Question:   Supervising Provider    Answer:   Isa Rankin [[id number]]    Augmentin prescribed. Take as prescribed and to completion Use naproxen as needed for pain Viscous lidocaine as needed for pain relief Recommend soft diet until evaluated by dentist Maintain oral hygiene care Follow up with dentist as soon as possible for further evaluation and treatment   Reviewed expectations re: course of current medical issues. Questions answered. Outlined signs and symptoms indicating need for more acute intervention. Patient verbalized understanding. After Visit Summary given.   Rennis Harding, PA-C 06/12/18 1807

## 2018-06-12 NOTE — Discharge Instructions (Addendum)
Augmentin prescribed. Take as prescribed and to completion Use naproxen as needed for pain Viscous lidocaine as needed for pain relief Recommend soft diet until evaluated by dentist Maintain oral hygiene care Follow up with dentist as soon as possible for further evaluation and treatment

## 2018-06-12 NOTE — ED Triage Notes (Signed)
Pt c/o tooth pain, stastes he just got insurance to make appt with dentist ahd had a course of antibiotics finished but now states he might need a new round before he goes to the dentist

## 2018-11-28 ENCOUNTER — Encounter (HOSPITAL_COMMUNITY): Payer: Self-pay | Admitting: Emergency Medicine

## 2018-11-28 ENCOUNTER — Ambulatory Visit (HOSPITAL_COMMUNITY)
Admission: EM | Admit: 2018-11-28 | Discharge: 2018-11-28 | Disposition: A | Discharge status: Home / Self Care | Payer: Self-pay | Attending: Family Medicine | Admitting: Family Medicine

## 2018-11-28 ENCOUNTER — Other Ambulatory Visit: Payer: Self-pay

## 2018-11-28 DIAGNOSIS — K047 Periapical abscess without sinus: Secondary | ICD-10-CM | POA: Insufficient documentation

## 2018-11-28 MED ORDER — PENICILLIN V POTASSIUM 500 MG PO TABS: 500.0000 mg | ORAL_TABLET | Freq: Four times a day (QID) | ORAL | 0 refills | Status: AC

## 2018-11-28 MED ORDER — IBUPROFEN 800 MG PO TABS: 800.0000 mg | ORAL_TABLET | Freq: Three times a day (TID) | ORAL | 0 refills | Status: DC

## 2018-11-28 NOTE — ED Provider Notes (Signed)
[facility]    CSN: [id number] Arrival date & time: 11/28/18  [id number]     History   Chief Complaint Chief Complaint  Patient presents with  . Dental Pain    HPI [name] [name] is a 38 y.o. male.   Patient is a 38 year old male who presents for right lower dental pain that started yesterday.  Describes the pain is aching.  His pain is been constant.  He is not taking them for pain.  He does have multiple dental caries and broken teeth.  He does not see a dentist regularly.  He denies any fever, chills, headaches.  ROS per HPI      Past Medical History:  Diagnosis Date  . Distal radius fracture, left 08/2014    There are no active problems to display for this patient.   Past Surgical History:  Procedure Laterality Date  . FEMUR FRACTURE SURGERY Left 07/29/2008   closed treatment medial femoral epicondyle fx.  [name]. KNEE ASPIRATION Bilateral 07/29/2008  . OPEN REDUCTION INTERNAL FIXATION (ORIF) DISTAL RADIAL FRACTURE Left 09/07/2014   Procedure: OPEN REDUCTION INTERNAL FIXATION (ORIF) DISTAL RADIAL FRACTURE;  Surgeon: Jodi Marbleavid A Thompson, MD;  Location: [facility];  Service: Orthopedics;  Laterality: Left;  Open treatment left distal radius fracture  . WOUND DEBRIDEMENT Left 07/29/2008   knee       Home Medications    Prior to Admission medications   Medication Sig Start Date End Date Taking? Authorizing Provider  ibuprofen (ADVIL,MOTRIN) 800 MG tablet Take 1 tablet (800 mg total) by mouth 3 (three) times daily. 11/28/18   Dahlia ByesBast, Gerry Blanchfield A, NP  penicillin v potassium (VEETID) 500 MG tablet Take 1 tablet (500 mg total) by mouth 4 (four) times daily for 10 days. 11/28/18 12/08/18  Janace ArisBast, Briani Maul A, NP    Family History Family History  Problem Relation Age of Onset  . Hypertension Mother   . Hypertension Other     Social History Social History   Tobacco Use  . Smoking status: Current Every Day Smoker    Packs/day: 0.50    Years: 10.00    Pack years:  5.00    Types: Cigarettes  . Smokeless tobacco: Never Used  Substance Use Topics  . Alcohol use: Yes    Comment: occasionally  . Drug use: No     Allergies   Shrimp [shellfish allergy]   Review of Systems Review of Systems   Physical Exam Triage Vital Signs ED Triage Vitals [11/28/18 0827]  Enc Vitals Group     BP 131/84     Pulse Rate 73     Resp 16     Temp 98.2 F (36.8 C)     Temp Source Oral     SpO2 97 %     Weight      Height      Head Circumference      Peak Flow      Pain Score 8     Pain Loc      Pain Edu?      Excl. in GC?    No data found.  Updated Vital Signs BP 131/84   Pulse 73   Temp 98.2 F (36.8 C) (Oral)   Resp 16   SpO2 97%   Visual Acuity Right Eye Distance:   Left Eye Distance:   Bilateral Distance:    Right Eye Near:   Left Eye Near:    Bilateral Near:     Physical Exam  Vitals signs and nursing note reviewed.  Constitutional:      General: He is not in acute distress.    Appearance: Normal appearance. He is not ill-appearing, toxic-appearing or diaphoretic.  HENT:     Head: Normocephalic and atraumatic.     Nose: Nose normal.     Mouth/Throat:     Mouth: Mucous membranes are moist.     Pharynx: Oropharynx is clear.      Comments: No trismus or facial swelling.  Multiple dental caries and broken right lower molar. gingival tenderness, erythema and swelling surrounding the broken molar.  Eyes:     Conjunctiva/sclera: Conjunctivae normal.  Neck:     Musculoskeletal: Normal range of motion.  Pulmonary:     Effort: Pulmonary effort is normal.  Musculoskeletal: Normal range of motion.  Lymphadenopathy:     Cervical: No cervical adenopathy.  Skin:    General: Skin is warm and dry.  Neurological:     Mental Status: He is alert.  Psychiatric:        Mood and Affect: Mood normal.      UC Treatments / Results  Labs (all labs ordered are listed, but only abnormal results are displayed) Labs Reviewed - No data to  display  EKG None  Radiology No results found.  Procedures Procedures (including critical care time)  Medications Ordered in UC Medications - No data to display  Initial Impression / Assessment and Plan / UC Course  I have reviewed the triage vital signs and the nursing notes.  Pertinent labs & imaging results that were available during my care of the patient were reviewed by me and considered in my medical decision making (see chart for details).     Treating for dental infection with penicillin Ibuprofen 800 mg for pain and inflammation Dental resources given Final Clinical Impressions(s) / UC Diagnoses   Final diagnoses:  Dental infection     Discharge Instructions     We will go ahead and treat you for dental infection with penicillin Ibuprofen 800 mg for pain Dental resources given Follow-up with dentist for further evaluation    ED Prescriptions    Medication Sig Dispense Auth. Provider   penicillin v potassium (VEETID) 500 MG tablet Take 1 tablet (500 mg total) by mouth 4 (four) times daily for 10 days. 40 tablet Isreal Moline A, NP   ibuprofen (ADVIL,MOTRIN) 800 MG tablet Take 1 tablet (800 mg total) by mouth 3 (three) times daily. 21 tablet Dahlia Byes A, NP     Controlled Substance Prescriptions [place] Controlled Substance Registry consulted? Not Applicable   [name], NP 11/28/18 [phone]

## 2018-11-28 NOTE — Discharge Instructions (Addendum)
We will go ahead and treat you for dental infection with penicillin Ibuprofen 800 mg for pain Dental resources given Follow-up with dentist for further evaluation

## 2018-11-28 NOTE — ED Triage Notes (Signed)
PT reports severe dental pain and gum swelling at the site of an injured tooth.

## 2019-03-17 ENCOUNTER — Encounter: Payer: Self-pay | Admitting: Emergency Medicine

## 2019-03-17 ENCOUNTER — Ambulatory Visit
Admission: EM | Admit: 2019-03-17 | Discharge: 2019-03-17 | Disposition: A | Discharge status: Home / Self Care | Payer: PRIVATE HEALTH INSURANCE | Attending: Family Medicine | Admitting: Family Medicine

## 2019-03-17 ENCOUNTER — Other Ambulatory Visit: Payer: Self-pay

## 2019-03-17 DIAGNOSIS — S39012A Strain of muscle, fascia and tendon of lower back, initial encounter: Secondary | ICD-10-CM

## 2019-03-17 DIAGNOSIS — X500XXA Overexertion from strenuous movement or load, initial encounter: Secondary | ICD-10-CM

## 2019-03-17 MED ORDER — CYCLOBENZAPRINE HCL 5 MG PO TABS: 5.0000 mg | ORAL_TABLET | Freq: Every day | ORAL | 0 refills | Status: DC

## 2019-03-17 MED ORDER — DICLOFENAC SODIUM 75 MG PO TBEC: 75.0000 mg | DELAYED_RELEASE_TABLET | Freq: Two times a day (BID) | ORAL | 0 refills | Status: DC

## 2019-03-17 NOTE — ED Triage Notes (Signed)
Pt presents to [facility] for assessment of sudden onset of lower back pain, worse when he is laying in bed.  Denies any known injury.  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  Denies changes in urination.

## 2019-03-17 NOTE — ED Provider Notes (Signed)
[name]    CSN: [id number] Arrival date & time: 03/17/19  1155     History   Chief Complaint Chief Complaint  Patient presents with  . Back Pain    HPI [name] [name] is a 38 y.o. male.   [facility] assessment of sudden onset of lower back pain, worse when he is laying in bed.  Denies any known injury.  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  Denies changes in urination.  Pain started two days ago at home.  No fever.  No recent trauma.  Pain worse when twisting.  No prior injury.  No fever.  No cough.  No radiating pain.    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     Past Medical History:  Diagnosis Date  . Distal radius fracture, left 08/2014    There are no active problems to display for this patient.   Past Surgical History:  Procedure Laterality Date  . FEMUR FRACTURE SURGERY Left 07/29/2008   closed treatment medial femoral epicondyle fx.  [name]. KNEE ASPIRATION Bilateral 07/29/2008  . OPEN REDUCTION INTERNAL FIXATION (ORIF) DISTAL RADIAL FRACTURE Left 09/07/2014   Procedure: OPEN REDUCTION INTERNAL FIXATION (ORIF) DISTAL RADIAL FRACTURE;  Surgeon: Jodi Marbleavid A Thompson, MD;  Location: [facility];  Service: Orthopedics;  Laterality: Left;  Open treatment left distal radius fracture  . WOUND DEBRIDEMENT Left 07/29/2008   knee       Home Medications    Prior to Admission medications   Medication Sig Start Date End Date Taking? Authorizing Provider  cyclobenzaprine (FLEXERIL) 5 MG tablet Take 1 tablet (5 mg total) by mouth at bedtime. 03/17/19   Elvina SidleLauenstein, Taysean Wager, MD  diclofenac (VOLTAREN) 75 MG EC tablet Take 1 tablet (75 mg total) by mouth 2 (two) times daily. 03/17/19   Elvina SidleLauenstein, Jaycob Mcclenton, MD    Family History Family History  Problem Relation Age of Onset  . Hypertension Mother   . Hypertension Other     Social History Social History   Tobacco Use   . Smoking status: Current Every Day Smoker    Packs/day: 0.50    Years: 10.00    Pack years: 5.00    Types: Cigarettes  . Smokeless tobacco: Never Used  Substance Use Topics  . Alcohol use: Yes    Comment: occasionally  . Drug use: No     Allergies   Shrimp [shellfish allergy]   Review of Systems Review of Systems   Physical Exam Triage Vital Signs ED Triage Vitals  Enc Vitals Group     BP 03/17/19 1204 (!) 137/99     Pulse Rate 03/17/19 1204 96     Resp 03/17/19 1204 18     Temp 03/17/19 1204 98.2 F (36.8 C)     Temp Source 03/17/19 1204 Oral     SpO2 03/17/19 1204 98 %     Weight --      Height --      Head Circumference --      Peak Flow --      Pain Score 03/17/19 1205 8     Pain Loc --      Pain Edu? --      Excl. in GC? --    No data found.  Updated Vital Signs BP (!) 137/99 (BP Location: Left Arm)   Pulse 96   Temp  98.2 F (36.8 C) (Oral)   Resp 18   SpO2 98%    Physical Exam Vitals signs and nursing note reviewed.  Constitutional:      General: He is not in acute distress.    Appearance: Normal appearance. He is normal weight. He is diaphoretic. He is not ill-appearing or toxic-appearing.  HENT:     Head: Normocephalic.     Nose: Nose normal.     Mouth/Throat:     Pharynx: Oropharynx is clear.  Eyes:     Conjunctiva/sclera: Conjunctivae normal.  Neck:     Musculoskeletal: Normal range of motion and neck supple.  Cardiovascular:     Rate and Rhythm: Normal rate.     Pulses: Normal pulses.     Heart sounds: Normal heart sounds.  Pulmonary:     Effort: Pulmonary effort is normal.     Breath sounds: Normal breath sounds.  Abdominal:     General: Abdomen is flat.     Palpations: Abdomen is soft.  Musculoskeletal: Normal range of motion.     Comments: Good back ROM Inspection of back and palpation of lumbar spine are normal  Skin:    General: Skin is warm.  Neurological:     General: No focal deficit present.     Mental Status:  He is alert.  Psychiatric:        Mood and Affect: Mood normal.        Behavior: Behavior normal.      UC Treatments / Results  Labs (all labs ordered are listed, but only abnormal results are displayed) Labs Reviewed - No data to display  EKG None  Radiology No results found.  Procedures Procedures (including critical care time)  Medications Ordered in UC Medications - No data to display  Initial Impression / Assessment and Plan / UC Course  I have reviewed the triage vital signs and the nursing notes.  Pertinent labs & imaging results that were available during my care of the patient were reviewed by me and considered in my medical decision making (see chart for details).    Final Clinical Impressions(s) / UC Diagnoses   Final diagnoses:  Strain of lumbar region, initial encounter     Discharge Instructions     Gently stretch the back muscles several times a day    ED Prescriptions    Medication Sig Dispense Auth. Provider   diclofenac (VOLTAREN) 75 MG EC tablet Take 1 tablet (75 mg total) by mouth 2 (two) times daily. 14 tablet Elvina Sidle, MD   cyclobenzaprine (FLEXERIL) 5 MG tablet Take 1 tablet (5 mg total) by mouth at bedtime. 7 tablet Elvina Sidle, MD     Controlled Substance Prescriptions [place] Controlled Substance Registry consulted? Not Applicable   Elvina Sidle, MD 03/17/19 1230

## 2019-03-17 NOTE — ED Notes (Signed)
Patient able to ambulate independently  

## 2019-03-17 NOTE — Discharge Instructions (Signed)
Gently stretch the back muscles several times a day

## 2019-12-11 ENCOUNTER — Telehealth (HOSPITAL_COMMUNITY): Payer: Self-pay | Admitting: Emergency Medicine

## 2019-12-11 ENCOUNTER — Ambulatory Visit (HOSPITAL_COMMUNITY)
Admission: EM | Admit: 2019-12-11 | Discharge: 2019-12-11 | Disposition: A | Discharge status: Home / Self Care | Payer: PRIVATE HEALTH INSURANCE | Attending: Family Medicine | Admitting: Family Medicine

## 2019-12-11 ENCOUNTER — Encounter (HOSPITAL_COMMUNITY): Payer: Self-pay | Admitting: Emergency Medicine

## 2019-12-11 ENCOUNTER — Other Ambulatory Visit: Payer: Self-pay

## 2019-12-11 ENCOUNTER — Ambulatory Visit (INDEPENDENT_AMBULATORY_CARE_PROVIDER_SITE_OTHER): Payer: PRIVATE HEALTH INSURANCE

## 2019-12-11 DIAGNOSIS — S2231XA Fracture of one rib, right side, initial encounter for closed fracture: Secondary | ICD-10-CM

## 2019-12-11 MED ORDER — IBUPROFEN 800 MG PO TABS: 800.0000 mg | ORAL_TABLET | Freq: Three times a day (TID) | ORAL | 0 refills | Status: DC

## 2019-12-11 MED ORDER — HYDROCODONE-ACETAMINOPHEN 5-325 MG PO TABS: 1.0000 | ORAL_TABLET | Freq: Four times a day (QID) | ORAL | 0 refills | Status: DC | PRN

## 2019-12-11 MED ORDER — HYDROCODONE-ACETAMINOPHEN 5-325 MG PO TABS
1.0000 | ORAL_TABLET | Freq: Once | ORAL | Status: AC
  Administered 2019-12-11: 1 via ORAL

## 2019-12-11 MED ORDER — HYDROCODONE-ACETAMINOPHEN 5-325 MG PO TABS
ORAL_TABLET | ORAL | Status: AC
  Filled 2019-12-11: qty 1

## 2019-12-11 NOTE — Discharge Instructions (Addendum)
You have fractured one of your ribs.  I am giving you 800 mg of ibuprofen to take every 8 hours for pain and inflammation. Hydrocodone for severe pain Make sure you are doing some deep breathing exercises to avoid infection settling in your lungs. It would most likely be weeks before this heals. Follow up as needed for continued or worsening symptoms

## 2019-12-11 NOTE — Telephone Encounter (Signed)
[facility] called wanting to know the patients diagnosis.  Pharmacist Katrina given diagnosis so she could dispense Norco.  [facility] calling to report his right rib fracture.  Report given by Erskine Squibb.  Dahlia Byes, NP aware of both phone calls.

## 2019-12-11 NOTE — ED Provider Notes (Signed)
[facility]    CSN: [id number] Arrival date & time: 12/11/19  7829      History   Chief Complaint Chief Complaint  Patient presents with  . Fall  . Chest Pain    right    HPI [name] [name] is a 39 y.o. male.   Patient is a 39 year old male with no significant past medical history.  He presents today with right sided rib discomfort and bruising after a fall.  He fell last night landing his right flank area on a stairwell.  His pain is been constant.  He has not take anything for his pain.  Denies any trouble breathing.  Denies any trouble with urination or blood in urination.  ROS per HPI    Fall  Chest Pain   Past Medical History:  Diagnosis Date  . Distal radius fracture, left 08/2014    There are no problems to display for this patient.   Past Surgical History:  Procedure Laterality Date  . FEMUR FRACTURE SURGERY Left 07/29/2008   closed treatment medial femoral epicondyle fx.  [name] KNEE ASPIRATION Bilateral 07/29/2008  . OPEN REDUCTION INTERNAL FIXATION (ORIF) DISTAL RADIAL FRACTURE Left 09/07/2014   Procedure: OPEN REDUCTION INTERNAL FIXATION (ORIF) DISTAL RADIAL FRACTURE;  Surgeon: Jolyn Nap, MD;  Location: [facility];  Service: Orthopedics;  Laterality: Left;  Open treatment left distal radius fracture  . WOUND DEBRIDEMENT Left 07/29/2008   knee       Home Medications    Prior to Admission medications   Medication Sig Start Date End Date Taking? Authorizing Provider  HYDROcodone-acetaminophen (NORCO/VICODIN) 5-325 MG tablet Take 1-2 tablets by mouth every 6 (six) hours as needed. 12/11/19   Loura Halt A, NP  ibuprofen (ADVIL) 800 MG tablet Take 1 tablet (800 mg total) by mouth 3 (three) times daily. 12/11/19   Orvan July, NP    Family History Family History  Problem Relation Age of Onset  . Hypertension Mother   . Hypertension Other     Social History Social History   Tobacco Use  . Smoking status: Current  Every Day Smoker    Packs/day: 0.50    Years: 10.00    Pack years: 5.00    Types: Cigarettes  . Smokeless tobacco: Never Used  Substance Use Topics  . Alcohol use: Yes    Comment: occasionally  . Drug use: No     Allergies   Shrimp [shellfish allergy]   Review of Systems Review of Systems   Physical Exam Triage Vital Signs ED Triage Vitals [12/11/19 0845]  Enc Vitals Group     BP      Pulse      Resp      Temp      Temp src      SpO2      Weight      Height      Head Circumference      Peak Flow      Pain Score 10     Pain Loc      Pain Edu?      Excl. in [place]?    No data found.  Updated Vital Signs BP (!) 142/96 (BP Location: Left Arm)   Pulse (!) 107   Temp 99 F (37.2 C) (Oral)   Resp 18   SpO2 96%   Visual Acuity Right Eye Distance:   Left Eye Distance:   Bilateral Distance:    Right Eye Near:  Left Eye Near:    Bilateral Near:     Physical Exam Vitals and nursing note reviewed.  Constitutional:      Appearance: Normal appearance.     Comments: Appears in pain   HENT:     Head: Normocephalic and atraumatic.     Nose: Nose normal.  Eyes:     Conjunctiva/sclera: Conjunctivae normal.  Pulmonary:     Effort: Pulmonary effort is normal.  Abdominal:       Comments: TTP over right rib area with mild bruising. No swelling, deformity.   Musculoskeletal:        General: Normal range of motion.     Cervical back: Normal range of motion.  Skin:    General: Skin is warm and dry.  Neurological:     Mental Status: He is alert.  Psychiatric:        Mood and Affect: Mood normal.      UC Treatments / Results  Labs (all labs ordered are listed, but only abnormal results are displayed) Labs Reviewed - No data to display  EKG   Radiology DG Ribs Unilateral W/Chest Right  Result Date: 12/11/2019 CLINICAL DATA:  Pain following fall EXAM: RIGHT RIBS AND CHEST - 3+ VIEW COMPARISON:  Chest radiograph September 26, 2008 FINDINGS: Frontal chest  as well as oblique and cone-down rib images were obtained. Lungs are clear. Heart size and pulmonary vascularity are normal. No adenopathy. There is no pneumothorax or pleural effusion. There is a nondisplaced fracture of the anterior right seventh rib. No other fracture appreciable. IMPRESSION: Nondisplaced fracture anterior right seventh rib. No other fracture evident. No pneumothorax or pleural effusion. Lungs clear. These results will be called to the ordering clinician or representative by the Radiologist Assistant, and communication documented in the PACS or zVision Dashboard. Electronically Signed   By: Bretta Bang III M.D.   On: 12/11/2019 09:10    Procedures Procedures (including critical care time)  Medications Ordered in UC Medications  HYDROcodone-acetaminophen (NORCO/VICODIN) 5-325 MG per tablet 1 tablet (1 tablet Oral Given 12/11/19 0927)    Initial Impression / Assessment and Plan / UC Course  I have reviewed the triage vital signs and the nursing notes.  Pertinent labs & imaging results that were available during my care of the patient were reviewed by me and considered in my medical decision making (see chart for details).     Nondisplaced fracture of seventh rib to right-no signs or concerns for pneumothorax Hydrocodone given here for pain.  Sending home with prescription for 800 mg ibuprofen and hydrocodone for severe pain. Recommended doing deep breathing exercises for 5 times a day to prevent pneumonia Follow up as needed for continued or worsening symptoms  Final Clinical Impressions(s) / UC Diagnoses   Final diagnoses:  Closed fracture of one rib of right side, initial encounter     Discharge Instructions     You have fractured one of your ribs.  I am giving you 800 mg of ibuprofen to take every 8 hours for pain and inflammation. Hydrocodone for severe pain Make sure you are doing some deep breathing exercises to avoid infection settling in your  lungs. It would most likely be weeks before this heals. Follow up as needed for continued or worsening symptoms     ED Prescriptions    Medication Sig Dispense Auth. Provider   ibuprofen (ADVIL) 800 MG tablet Take 1 tablet (800 mg total) by mouth 3 (three) times daily. 21 tablet Yisrael Obryan A,  NP   HYDROcodone-acetaminophen (NORCO/VICODIN) 5-325 MG tablet Take 1-2 tablets by mouth every 6 (six) hours as needed. 12 tablet Isamar Nazir A, NP     I have reviewed the PDMP during this encounter.   [name], NP 12/11/19 [phone]

## 2019-12-11 NOTE — ED Notes (Signed)
Date and time results received: 12/11/19 1015 (use smartphrase ".now" to insert current time)  Test: xray Critical Value: right rib fracture  Name of Provider Notified: Dahlia Byes, NP  Orders Received? Or Actions Taken?: Pt was already discharged from facility with instructions and medications.  Provider already aware of the result.

## 2019-12-11 NOTE — ED Triage Notes (Signed)
Pt slipped on some steps last night and landed on his right side, injuring his right rib cage.  Pt is in a lot of pain this morning, 10/10 with very slow movements to avoid elevating the pain. Pt has had no pain relievers.

## 2021-04-14 ENCOUNTER — Encounter: Payer: Self-pay | Admitting: Emergency Medicine

## 2021-04-14 ENCOUNTER — Ambulatory Visit
Admission: EM | Admit: 2021-04-14 | Discharge: 2021-04-14 | Disposition: A | Discharge status: Home / Self Care | Payer: PRIVATE HEALTH INSURANCE | Attending: Internal Medicine | Admitting: Internal Medicine

## 2021-04-14 ENCOUNTER — Other Ambulatory Visit: Payer: Self-pay

## 2021-04-14 DIAGNOSIS — K0889 Other specified disorders of teeth and supporting structures: Secondary | ICD-10-CM

## 2021-04-14 MED ORDER — TRAMADOL HCL 50 MG PO TABS: 50.0000 mg | ORAL_TABLET | Freq: Four times a day (QID) | ORAL | 0 refills | Status: DC | PRN

## 2021-04-14 MED ORDER — PENICILLIN V POTASSIUM 500 MG PO TABS: 500.0000 mg | ORAL_TABLET | Freq: Four times a day (QID) | ORAL | 0 refills | Status: AC

## 2021-04-14 NOTE — Discharge Instructions (Addendum)
If you want dental insurance check [email or web address] and sign up to pay monthly.  Please follow up with a dentist soon

## 2021-04-14 NOTE — ED Triage Notes (Signed)
Pt has a bad tooth on the right side side and his tooth broke off 2 days ago. Pt said hard to chew and eat.

## 2021-04-14 NOTE — ED Provider Notes (Signed)
[name]    CSN: [id number] Arrival date & time: 04/14/21  8756      History   Chief Complaint Chief Complaint  Patient presents with  . Dental Pain    HPI [name] [name] is a 40 y.o. male who presents R lower molar brake 2 days ago and since then has been hurting more. Does not have dental insurance. Denies fever, but his R face has been hurting.   Past Medical History:  Diagnosis Date  . Distal radius fracture, left 08/2014   There are no problems to display for this patient.   Past Surgical History:  Procedure Laterality Date  . FEMUR FRACTURE SURGERY Left 07/29/2008   closed treatment medial femoral epicondyle fx.  [name] KNEE ASPIRATION Bilateral 07/29/2008  . OPEN REDUCTION INTERNAL FIXATION (ORIF) DISTAL RADIAL FRACTURE Left 09/07/2014   Procedure: OPEN REDUCTION INTERNAL FIXATION (ORIF) DISTAL RADIAL FRACTURE;  Surgeon: Jodi Marble, MD;  Location: [facility];  Service: Orthopedics;  Laterality: Left;  Open treatment left distal radius fracture  . WOUND DEBRIDEMENT Left 07/29/2008   knee    Home Medications    Prior to Admission medications   Medication Sig Start Date End Date Taking? Authorizing Provider  penicillin v potassium (VEETID) 500 MG tablet Take 1 tablet (500 mg total) by mouth 4 (four) times daily for 10 days. 04/14/21 04/24/21 Yes Rodriguez-Southworth, Nettie Elm, PA-C  traMADol (ULTRAM) 50 MG tablet Take 1 tablet (50 mg total) by mouth every 6 (six) hours as needed. 04/14/21  Yes Rodriguez-Southworth, Nettie Elm, PA-C  ibuprofen (ADVIL) 800 MG tablet Take 1 tablet (800 mg total) by mouth 3 (three) times daily. 12/11/19   [name], NP    Family History Family History  Problem Relation Age of Onset  . Hypertension Mother   . Hypertension Other     Social History Social History   Tobacco Use  . Smoking status: Current Every Day Smoker    Packs/day: 0.50    Years: 10.00    Pack years: 5.00    Types: Cigarettes  .  Smokeless tobacco: Never Used  Substance Use Topics  . Alcohol use: Yes    Comment: occasionally  . Drug use: No     Allergies   Shrimp [shellfish allergy]   Review of Systems Review of Systems  Constitutional: Positive for diaphoresis. Negative for fever.       Denies drug use or withdrawing from opiates   HENT: Positive for dental problem. Negative for facial swelling.   Endocrine:       Denies hx of DM  Hematological: Negative for adenopathy.     Physical Exam Triage Vital Signs ED Triage Vitals  Enc Vitals Group     BP 04/14/21 1038 (!) 150/100     Pulse Rate 04/14/21 1038 89     Resp 04/14/21 1038 16     Temp 04/14/21 1038 98.6 F (37 C)     Temp Source 04/14/21 1038 Oral     SpO2 04/14/21 1038 97 %     Weight --      Height --      Head Circumference --      Peak Flow --      Pain Score 04/14/21 1039 8     Pain Loc --      Pain Edu? --      Excl. in [place]? --    No data found.  Updated Vital Signs BP (!) 142/99 (BP Location: Right  Arm)   Pulse 89   Temp 98.6 F (37 C) (Oral)   Resp 16   SpO2 97%   Visual Acuity Right Eye Distance:   Left Eye Distance:   Bilateral Distance:    Right Eye Near:   Left Eye Near:    Bilateral Near:     Physical Exam Skin:    Comments: diaphoretic  Neurological:     Mental Status: He is oriented to person, place, and time.  Psychiatric:        Mood and Affect: Mood normal.        Behavior: Behavior normal.        Thought Content: Thought content normal.        Judgment: Judgment normal.      UC Treatments / Results  Labs (all labs ordered are listed, but only abnormal results are displayed) Labs Reviewed - No data to display  EKG   Radiology No results found.  Procedures Procedures (including critical care time)  Medications Ordered in UC Medications - No data to display  Initial Impression / Assessment and Plan / UC Course  I have reviewed the triage vital signs and the nursing  notes. Chipped molar with possible infection. I placed him on Tramadol and Penicillin. Needs to see a dentist to have the rest of the molar pieces removed  Final Clinical Impressions(s) / UC Diagnoses   Final diagnoses:  Pain, dental     Discharge Instructions     If you want dental insurance check [email or web address] and sign up to pay monthly.  Please follow up with a dentist soon       ED Prescriptions    Medication Sig Dispense Auth. Provider   penicillin v potassium (VEETID) 500 MG tablet Take 1 tablet (500 mg total) by mouth 4 (four) times daily for 10 days. 40 tablet Rodriguez-Southworth, Cayleigh Paull, PA-C   traMADol (ULTRAM) 50 MG tablet Take 1 tablet (50 mg total) by mouth every 6 (six) hours as needed. 15 tablet Rodriguez-Southworth, Nettie Elm, PA-C     I have reviewed the PDMP during this encounter.   [name], Cordelia Poche 04/14/21 2137

## 2021-06-25 ENCOUNTER — Other Ambulatory Visit: Payer: Self-pay

## 2021-06-25 ENCOUNTER — Encounter: Payer: Self-pay | Admitting: Emergency Medicine

## 2021-06-25 ENCOUNTER — Ambulatory Visit
Admission: EM | Admit: 2021-06-25 | Discharge: 2021-06-25 | Disposition: A | Discharge status: Home / Self Care | Payer: PRIVATE HEALTH INSURANCE | Attending: Family Medicine | Admitting: Family Medicine

## 2021-06-25 DIAGNOSIS — H938X1 Other specified disorders of right ear: Secondary | ICD-10-CM

## 2021-06-25 DIAGNOSIS — R0981 Nasal congestion: Secondary | ICD-10-CM

## 2021-06-25 MED ORDER — IPRATROPIUM BROMIDE 0.03 % NA SOLN: 2.0000 | Freq: Two times a day (BID) | NASAL | 0 refills | Status: DC

## 2021-06-25 MED ORDER — PREDNISONE 20 MG PO TABS: 20.0000 mg | ORAL_TABLET | Freq: Every day | ORAL | 0 refills | Status: AC

## 2021-06-25 NOTE — ED Triage Notes (Signed)
Can't hear out of right ear over the last week. Denies pain. No obvious cerumen build up in canal, tympanic membrane visible, no redness visible

## 2021-06-25 NOTE — ED Provider Notes (Signed)
[name]    CSN: [id number] Arrival date & time: 06/25/21  8185      History   Chief Complaint No chief complaint on file.   HPI [name] [name] is a 40 y.o. male.   HPI Patient presents for evaluation of right ear fullness, difficulty hearing and sinus problems.  He reports a history of persistent nasal congestion. He has not taken any medication for symptoms.  Denies any recent swimming or cerumen impaction.  Denies any fever.   Past Medical History:  Diagnosis Date   Distal radius fracture, left 08/2014    There are no problems to display for this patient.   Past Surgical History:  Procedure Laterality Date   FEMUR FRACTURE SURGERY Left 07/29/2008   closed treatment medial femoral epicondyle fx.   KNEE ASPIRATION Bilateral 07/29/2008   OPEN REDUCTION INTERNAL FIXATION (ORIF) DISTAL RADIAL FRACTURE Left 09/07/2014   Procedure: OPEN REDUCTION INTERNAL FIXATION (ORIF) DISTAL RADIAL FRACTURE;  Surgeon: Jodi Marble, MD;  Location: [facility];  Service: Orthopedics;  Laterality: Left;  Open treatment left distal radius fracture   WOUND DEBRIDEMENT Left 07/29/2008   knee       Home Medications    Prior to Admission medications   Medication Sig Start Date End Date Taking? Authorizing Provider  ipratropium (ATROVENT) 0.03 % nasal spray Place 2 sprays into both nostrils 2 (two) times daily. 06/25/21  Yes Bing Neighbors, FNP  predniSONE (DELTASONE) 20 MG tablet Take 1 tablet (20 mg total) by mouth daily with breakfast for 5 days. 06/25/21 06/30/21 Yes Bing Neighbors, FNP  ibuprofen (ADVIL) 800 MG tablet Take 1 tablet (800 mg total) by mouth 3 (three) times daily. 12/11/19   Dahlia Byes A, NP  traMADol (ULTRAM) 50 MG tablet Take 1 tablet (50 mg total) by mouth every 6 (six) hours as needed. 04/14/21   Rodriguez-Southworth, Nettie Elm, PA-C    Family History Family History  Problem Relation Age of Onset   Hypertension Mother     Hypertension Other     Social History Social History   Tobacco Use   Smoking status: Every Day    Packs/day: 0.50    Years: 10.00    Pack years: 5.00    Types: Cigarettes   Smokeless tobacco: Never  Substance Use Topics   Alcohol use: Yes    Comment: occasionally   Drug use: No     Allergies   Shrimp [shellfish allergy]   Review of Systems Review of Systems Pertinent negatives listed in HPI   Physical Exam Triage Vital Signs ED Triage Vitals [06/25/21 1011]  Enc Vitals Group     BP (!) 144/97     Pulse Rate 75     Resp 16     Temp 98 F (36.7 C)     Temp Source Oral     SpO2 98 %     Weight      Height      Head Circumference      Peak Flow      Pain Score 0     Pain Loc      Pain Edu?      Excl. in [place]?    No data found.  Updated Vital Signs BP (!) 144/97 (BP Location: Right Arm)   Pulse 75   Temp 98 F (36.7 C) (Oral)   Resp 16   SpO2 98%   Visual Acuity Right Eye Distance:   Left Eye Distance:  Bilateral Distance:    Right Eye Near:   Left Eye Near:    Bilateral Near:     Physical Exam  General Appearance:    Alert,non ill appearing, cooperative, no distress  HENT:   Normocephalic, Right ear MEE, left ear normal, nares mucosal edema with congestion, rhinorrhea, oropharynx patent   Eyes:    PERRL, conjunctiva/corneas clear, EOM's intact       Lungs:     Clear to auscultation bilaterally, respirations unlabored  Heart:    Regular rate and rhythm  Neurologic:   Awake, alert, oriented x 3. No apparent focal neurological deficit      UC Treatments / Results  Labs (all labs ordered are listed, but only abnormal results are displayed) Labs Reviewed - No data to display  EKG   Radiology No results found.  Procedures Procedures (including critical care time)  Medications Ordered in UC Medications - No data to display  Initial Impression / Assessment and Plan / UC Course  I have reviewed the triage vital signs and the nursing  notes.  Pertinent labs & imaging results that were available during my care of the patient were reviewed by me and considered in my medical decision making (see chart for details).    Ear congestion involving the right ear and sinus congestion treatment today prednisone and Atrovent nasal spray.  No concern for underlying infection therefore antibiotics are not warranted.  Return precautions given if symptoms worsen or do not improve.   Final Clinical Impressions(s) / UC Diagnoses   Final diagnoses:  Ear congestion, right  Sinus congestion   Discharge Instructions   None    ED Prescriptions     Medication Sig Dispense Auth. Provider   predniSONE (DELTASONE) 20 MG tablet Take 1 tablet (20 mg total) by mouth daily with breakfast for 5 days. 5 tablet Bing Neighbors, FNP   ipratropium (ATROVENT) 0.03 % nasal spray Place 2 sprays into both nostrils 2 (two) times daily. 30 mL Bing Neighbors, FNP      PDMP not reviewed this encounter.   Bing Neighbors, FNP 06/25/21 1115

## 2021-07-10 ENCOUNTER — Ambulatory Visit
Admission: EM | Admit: 2021-07-10 | Discharge: 2021-07-10 | Disposition: A | Discharge status: Home / Self Care | Payer: Self-pay | Attending: Internal Medicine | Admitting: Internal Medicine

## 2021-07-10 ENCOUNTER — Other Ambulatory Visit: Payer: Self-pay

## 2021-07-10 ENCOUNTER — Encounter: Payer: Self-pay | Admitting: Emergency Medicine

## 2021-07-10 DIAGNOSIS — S50812A Abrasion of left forearm, initial encounter: Secondary | ICD-10-CM

## 2021-07-10 MED ORDER — TETANUS-DIPHTH-ACELL PERTUSSIS 5-2.5-18.5 LF-MCG/0.5 IM SUSY: 0.5000 mL | PREFILLED_SYRINGE | Freq: Once | INTRAMUSCULAR | Status: DC

## 2021-07-10 NOTE — ED Triage Notes (Signed)
Pt here for abrasion to left forearm from mirror; bleeding controlled at present

## 2021-07-10 NOTE — ED Provider Notes (Signed)
[name]    CSN: [id number] Arrival date & time: 07/10/21  1234      History   Chief Complaint Chief Complaint  Patient presents with   Abrasion    HPI [name] [name] is a 40 y.o. male.   Patient is presents to urgent care for further evaluation of an abrasion to left forearm that occurred after he stuck his arm in a closet and cut it on a mirror that was sitting in the closet.  Patient is not sure when last tetanus vaccine was updated.  Denies any numbness or tingling in extremity.    Past Medical History:  Diagnosis Date   Distal radius fracture, left 08/2014    There are no problems to display for this patient.   Past Surgical History:  Procedure Laterality Date   FEMUR FRACTURE SURGERY Left 07/29/2008   closed treatment medial femoral epicondyle fx.   KNEE ASPIRATION Bilateral 07/29/2008   OPEN REDUCTION INTERNAL FIXATION (ORIF) DISTAL RADIAL FRACTURE Left 09/07/2014   Procedure: OPEN REDUCTION INTERNAL FIXATION (ORIF) DISTAL RADIAL FRACTURE;  Surgeon: Jodi Marble, MD;  Location: [facility];  Service: Orthopedics;  Laterality: Left;  Open treatment left distal radius fracture   WOUND DEBRIDEMENT Left 07/29/2008   knee       Home Medications    Prior to Admission medications   Medication Sig Start Date End Date Taking? Authorizing Provider  ibuprofen (ADVIL) 800 MG tablet Take 1 tablet (800 mg total) by mouth 3 (three) times daily. 12/11/19   Bast, Gloris Manchester A, NP  ipratropium (ATROVENT) 0.03 % nasal spray Place 2 sprays into both nostrils 2 (two) times daily. 06/25/21   Bing Neighbors, FNP  traMADol (ULTRAM) 50 MG tablet Take 1 tablet (50 mg total) by mouth every 6 (six) hours as needed. Patient not taking: Reported on 07/10/2021 04/14/21   Rodriguez-Southworth, Nettie Elm, PA-C    Family History Family History  Problem Relation Age of Onset   Hypertension Mother    Hypertension Other     Social History Social History    Tobacco Use   Smoking status: Every Day    Packs/day: 0.50    Years: 10.00    Pack years: 5.00    Types: Cigarettes   Smokeless tobacco: Never  Substance Use Topics   Alcohol use: Yes    Comment: occasionally   Drug use: No     Allergies   Shrimp [shellfish allergy]   Review of Systems Review of Systems Per HPI  Physical Exam Triage Vital Signs ED Triage Vitals [07/10/21 1310]  Enc Vitals Group     BP (!) 163/79     Pulse Rate 90     Resp 18     Temp 97.9 F (36.6 C)     Temp Source Oral     SpO2 97 %     Weight      Height      Head Circumference      Peak Flow      Pain Score 4     Pain Loc      Pain Edu?      Excl. in [place]?    No data found.  Updated Vital Signs BP (!) 163/79 (BP Location: Right Arm)   Pulse 90   Temp 97.9 F (36.6 C) (Oral)   Resp 18   SpO2 97%   Visual Acuity Right Eye Distance:   Left Eye Distance:   Bilateral Distance:  Right Eye Near:   Left Eye Near:    Bilateral Near:     Physical Exam Constitutional:      Appearance: Normal appearance.  HENT:     Head: Normocephalic and atraumatic.  Eyes:     Extraocular Movements: Extraocular movements intact.     Conjunctiva/sclera: Conjunctivae normal.  Pulmonary:     Effort: Pulmonary effort is normal.  Skin:    General: Skin is warm and dry.     Findings: Abrasion present.     Comments: Approximately 5 cm x 1 cm superficial abrasion present to left forearm.  No foreign bodies noted.  Neurovascular intact.  Neurological:     General: No focal deficit present.     Mental Status: He is alert and oriented to person, place, and time. Mental status is at baseline.  Psychiatric:        Mood and Affect: Mood normal.        Behavior: Behavior normal.        Thought Content: Thought content normal.        Judgment: Judgment normal.     UC Treatments / Results  Labs (all labs ordered are listed, but only abnormal results are displayed) Labs Reviewed - No data to  display  EKG   Radiology No results found.  Procedures Procedures (including critical care time)  Medications Ordered in UC Medications - No data to display  Initial Impression / Assessment and Plan / UC Course  I have reviewed the triage vital signs and the nursing notes.  Pertinent labs & imaging results that were available during my care of the patient were reviewed by me and considered in my medical decision making (see chart for details).     Wound cleansed with Hibiclens.  No foreign bodies noted.  Triple antibiotic ointment applied to abrasion.  Dressing applied.  Patient was advised to change dressing daily and to keep covered, clean, dry especially while at work.  No sutures or wound repair needed.  Tetanus vaccine was updated in 2019 so no additional tetanus vaccine needed at this time.  Patient to monitor for signs of infection and to follow-up with these occur.Discussed strict return precautions. Patient verbalized understanding and is agreeable with plan.  Final Clinical Impressions(s) / UC Diagnoses   Final diagnoses:  Abrasion of left forearm, initial encounter     Discharge Instructions      Please monitor abrasion very closely for any signs of infection that include increased redness, swelling, pus, fever.  Follow-up if this occurs.  Please change dressing daily.  Keep covered, clean, dry while at work.     ED Prescriptions   None    PDMP not reviewed this encounter.   Lance Muss, FNP 07/10/21 1535

## 2021-07-10 NOTE — Discharge Instructions (Addendum)
Please monitor abrasion very closely for any signs of infection that include increased redness, swelling, pus, fever.  Follow-up if this occurs.  Please change dressing daily.  Keep covered, clean, dry while at work.

## 2023-11-06 ENCOUNTER — Encounter: Payer: Self-pay | Admitting: Emergency Medicine

## 2023-11-06 ENCOUNTER — Ambulatory Visit
Admission: EM | Admit: 2023-11-06 | Discharge: 2023-11-06 | Disposition: A | Discharge status: Home / Self Care | Payer: Self-pay | Attending: Family Medicine | Admitting: Family Medicine

## 2023-11-06 DIAGNOSIS — L309 Dermatitis, unspecified: Secondary | ICD-10-CM

## 2023-11-06 DIAGNOSIS — T7840XA Allergy, unspecified, initial encounter: Secondary | ICD-10-CM

## 2023-11-06 MED ORDER — PREDNISONE 50 MG PO TABS: ORAL_TABLET | ORAL | 0 refills | Status: DC

## 2023-11-06 NOTE — Discharge Instructions (Signed)
You may also take over the counter Benadryl as needed.

## 2023-11-06 NOTE — ED Provider Notes (Signed)
[facility]   [id number] 11/06/23 Arrival Time: 0802  ASSESSMENT & PLAN:  1. Dermatitis   2. Allergic reaction, initial encounter   No signs of skin infection. Begin: Meds ordered this encounter  Medications   predniSONE (DELTASONE) 50 MG tablet    Sig: Take one tablet by mouth for 5 days.    Dispense:  5 tablet    Refill:  0     Discharge Instructions      You may also take over the counter Benadryl as needed.      Will follow up with PCP or here if worsening or failing to improve as anticipated. Reviewed expectations re: course of current medical issues. Questions answered. Outlined signs and symptoms indicating need for more acute intervention. Patient verbalized understanding. After Visit Summary given.   SUBJECTIVE:  [name] [name] is a 42 y.o. male who presents with a skin complaint. Pt c/o he dyed his hair 2-3 days ago and now scalp has clear drainage coming out of it, he has puffy eyes, and has neck stiffness. Pt reports he has used the product before with no problems. Much itching.    OBJECTIVE: Vitals:   11/06/23 0816  BP: (!) 147/97  Pulse: 92  Resp: 18  Temp: 98.2 F (36.8 C)  TempSrc: Oral  SpO2: 97%    General appearance: alert; no distress HEENT: [place]; AT Neck: supple with FROM Lungs: clear to auscultation bilaterally Heart: regular Extremities: no edema; moves all extremities normally Skin: warm and dry; signs of infection: none; scalp and face with smooth, slightly elevated and erythematous plaques of variable size; no drainage noted over posterior scalp; areas also on forehead and cheeks Psychological: alert and cooperative; normal mood and affect  Allergies  Allergen Reactions   Shrimp [Shellfish Allergy] Itching and Swelling    OF THROAT - DENIES SOB    Past Medical History:  Diagnosis Date   Distal radius fracture, left 08/2014   Social History   Socioeconomic History   Marital status: Single    Spouse name:  Not on file   Number of children: Not on file   Years of education: Not on file   Highest education level: Not on file  Occupational History   Not on file  Tobacco Use   Smoking status: Every Day    Current packs/day: 0.50    Average packs/day: 0.5 packs/day for 10.0 years (5.0 ttl pk-yrs)    Types: Cigarettes   Smokeless tobacco: Never  Substance and Sexual Activity   Alcohol use: Yes    Comment: occasionally   Drug use: No   Sexual activity: Yes    Birth control/protection: Condom  Other Topics Concern   Not on file  Social History Narrative   Not on file   Social Determinants of Health   Financial Resource Strain: Not on file  Food Insecurity: Not on file  Transportation Needs: Not on file  Physical Activity: Not on file  Stress: Not on file  Social Connections: Not on file  Intimate Partner Violence: Not on file   Family History  Problem Relation Age of Onset   Hypertension Mother    Hypertension Other    Past Surgical History:  Procedure Laterality Date   FEMUR FRACTURE SURGERY Left 07/29/2008   closed treatment medial femoral epicondyle fx.   KNEE ASPIRATION Bilateral 07/29/2008   OPEN REDUCTION INTERNAL FIXATION (ORIF) DISTAL RADIAL FRACTURE Left 09/07/2014   Procedure: OPEN REDUCTION INTERNAL FIXATION (ORIF) DISTAL RADIAL FRACTURE;  Surgeon:  Jodi Marble, MD;  Location: [facility];  Service: Orthopedics;  Laterality: Left;  Open treatment left distal radius fracture   WOUND DEBRIDEMENT Left 07/29/2008   knee      Mardella Layman, MD 11/06/23 [phone]

## 2023-11-06 NOTE — ED Triage Notes (Signed)
Pt c/o he dyed his hair 2-3 days ago and now scalp has clear drainage coming out of it, he has puffy eyes, and has neck stiffness. Pt reports he has used the product before with no problems.

## 2024-01-13 ENCOUNTER — Emergency Department (HOSPITAL_COMMUNITY)
Admission: EM | Admit: 2024-01-13 | Discharge: 2024-01-14 | Disposition: A | Discharge status: Home / Self Care | Payer: Self-pay | Attending: Student | Admitting: Student

## 2024-01-13 ENCOUNTER — Other Ambulatory Visit: Payer: Self-pay

## 2024-01-13 ENCOUNTER — Encounter (HOSPITAL_COMMUNITY): Payer: Self-pay | Admitting: *Deleted

## 2024-01-13 DIAGNOSIS — F17210 Nicotine dependence, cigarettes, uncomplicated: Secondary | ICD-10-CM | POA: Insufficient documentation

## 2024-01-13 DIAGNOSIS — D72829 Elevated white blood cell count, unspecified: Secondary | ICD-10-CM | POA: Insufficient documentation

## 2024-01-13 DIAGNOSIS — E871 Hypo-osmolality and hyponatremia: Secondary | ICD-10-CM | POA: Insufficient documentation

## 2024-01-13 DIAGNOSIS — E860 Dehydration: Secondary | ICD-10-CM

## 2024-01-13 DIAGNOSIS — R000 Tachycardia, unspecified: Secondary | ICD-10-CM | POA: Insufficient documentation

## 2024-01-13 LAB — CBC
HCT: 42.3 % (ref 39.0–52.0)
Hemoglobin: 14.2 g/dL (ref 13.0–17.0)
MCH: 34.2 pg — ABNORMAL HIGH (ref 26.0–34.0)
MCHC: 33.6 g/dL (ref 30.0–36.0)
MCV: 101.9 fL — ABNORMAL HIGH (ref 80.0–100.0)
Platelets: 274 10*3/uL (ref 150–400)
RBC: 4.15 MIL/uL — ABNORMAL LOW (ref 4.22–5.81)
RDW: 13.2 % (ref 11.5–15.5)
WBC: 13.2 10*3/uL — ABNORMAL HIGH (ref 4.0–10.5)
nRBC: 0 % (ref 0.0–0.2)

## 2024-01-13 LAB — BASIC METABOLIC PANEL
Anion gap: 15 (ref 5–15)
BUN: 15 mg/dL (ref 6–20)
CO2: 18 mmol/L — ABNORMAL LOW (ref 22–32)
Calcium: 8.7 mg/dL — ABNORMAL LOW (ref 8.9–10.3)
Chloride: 100 mmol/L (ref 98–111)
Creatinine, Ser: 0.97 mg/dL (ref 0.61–1.24)
GFR, Estimated: >60 mL/min (ref >60)
Glucose, Bld: 164 mg/dL — ABNORMAL HIGH (ref 70–99)
Potassium: 3.7 mmol/L (ref 3.5–5.1)
Sodium: 133 mmol/L — ABNORMAL LOW (ref 135–145)

## 2024-01-13 LAB — TROPONIN I (HIGH SENSITIVITY): Troponin I (High Sensitivity): 4 ng/L (ref <18)

## 2024-01-13 NOTE — ED Triage Notes (Signed)
 The pt ate some food approx one hour ago he has had a heart racing with a headache and feeling tired

## 2024-01-14 LAB — RAPID URINE DRUG SCREEN, HOSP PERFORMED
Amphetamines: NOT DETECTED
Barbiturates: NOT DETECTED
Benzodiazepines: NOT DETECTED
Cocaine: NOT DETECTED
Opiates: NOT DETECTED
Tetrahydrocannabinol: NOT DETECTED

## 2024-01-14 LAB — RESP PANEL BY RT-PCR (RSV, FLU A&B, COVID)  RVPGX2
Influenza A by PCR: NEGATIVE
Influenza B by PCR: NEGATIVE
Resp Syncytial Virus by PCR: NEGATIVE
SARS Coronavirus 2 by RT PCR: NEGATIVE

## 2024-01-14 LAB — TSH: TSH: 1.036 u[IU]/mL (ref 0.350–4.500)

## 2024-01-14 LAB — TROPONIN I (HIGH SENSITIVITY): Troponin I (High Sensitivity): 4 ng/L (ref <18)

## 2024-01-14 MED ORDER — LACTATED RINGERS IV BOLUS
1000.0000 mL | Freq: Once | INTRAVENOUS | Status: AC
  Administered 2024-01-14: 1000 mL via INTRAVENOUS

## 2024-01-14 NOTE — ED Provider Notes (Signed)
 [place] EMERGENCY DEPARTMENT AT [facility] Provider Note  CSN: [id number] Arrival date & time: 01/13/24 2238  Chief Complaint(s) Tachycardia  HPI [name] [name] is a 43 y.o. male who presents emergency room for evaluation of rapid heart rate.  Patient states that approximately an hour prior to arrival he was eating some [place] food, felt like it got caught in his throat and started to have rapid heart rate.  He has been able to tolerate secretions after this event and is currently not spitting or vomiting.  He currently denies chest pain, shortness of breath, abdominal pain, nausea, vomiting, fever or any other systemic symptoms.  Denies illicit substance use.   Past Medical History Past Medical History:  Diagnosis Date   Distal radius fracture, left 08/2014   There are no active problems to display for this patient.  Home Medication(s) Prior to Admission medications   Medication Sig Start Date End Date Taking? Authorizing Provider  ibuprofen (ADVIL) 800 MG tablet Take 1 tablet (800 mg total) by mouth 3 (three) times daily. 12/11/19   Dahlia Byes A, FNP  ipratropium (ATROVENT) 0.03 % nasal spray Place 2 sprays into both nostrils 2 (two) times daily. 06/25/21   Bing Neighbors, NP  predniSONE (DELTASONE) 50 MG tablet Take one tablet by mouth for 5 days. 11/06/23   Mardella Layman, MD  traMADol (ULTRAM) 50 MG tablet Take 1 tablet (50 mg total) by mouth every 6 (six) hours as needed. Patient not taking: Reported on 07/10/2021 04/14/21   Rodriguez-Southworth, Nettie Elm, PA-C                                                                                                                                    Past Surgical History Past Surgical History:  Procedure Laterality Date   FEMUR FRACTURE SURGERY Left 07/29/2008   closed treatment medial femoral epicondyle fx.   KNEE ASPIRATION Bilateral 07/29/2008   OPEN REDUCTION INTERNAL FIXATION (ORIF) DISTAL RADIAL FRACTURE Left  09/07/2014   Procedure: OPEN REDUCTION INTERNAL FIXATION (ORIF) DISTAL RADIAL FRACTURE;  Surgeon: Jodi Marble, MD;  Location: [facility];  Service: Orthopedics;  Laterality: Left;  Open treatment left distal radius fracture   WOUND DEBRIDEMENT Left 07/29/2008   knee   Family History Family History  Problem Relation Age of Onset   Hypertension Mother    Hypertension Other     Social History Social History   Tobacco Use   Smoking status: Every Day    Current packs/day: 0.50    Average packs/day: 0.5 packs/day for 10.0 years (5.0 ttl pk-yrs)    Types: Cigarettes   Smokeless tobacco: Never  Substance Use Topics   Alcohol use: Yes    Comment: occasionally   Drug use: No   Allergies Shrimp [shellfish allergy]  Review of Systems Review of Systems  Cardiovascular:  Positive for palpitations.    Physical Exam Vital Signs  I  have reviewed the triage vital signs BP (!) 160/105 (BP Location: Right Arm)   Pulse (!) 119   Temp 98.1 F (36.7 C) (Oral)   Resp 18   Ht 5\' 9"  (1.753 m)   Wt 65.8 kg   SpO2 100%   BMI 21.42 kg/m   Physical Exam Constitutional:      General: He is not in acute distress.    Appearance: Normal appearance.  HENT:     Head: Normocephalic and atraumatic.     Nose: No congestion or rhinorrhea.  Eyes:     General:        Right eye: No discharge.        Left eye: No discharge.     Extraocular Movements: Extraocular movements intact.     Pupils: Pupils are equal, round, and reactive to light.  Cardiovascular:     Rate and Rhythm: Regular rhythm. Tachycardia present.     Heart sounds: No murmur heard. Pulmonary:     Effort: No respiratory distress.     Breath sounds: No wheezing or rales.  Abdominal:     General: There is no distension.     Tenderness: There is no abdominal tenderness.  Musculoskeletal:        General: Normal range of motion.     Cervical back: Normal range of motion.  Skin:    General: Skin is warm and  dry.  Neurological:     General: No focal deficit present.     Mental Status: He is alert.     ED Results and Treatments Labs (all labs ordered are listed, but only abnormal results are displayed) Labs Reviewed  BASIC METABOLIC PANEL - Abnormal; Notable for the following components:      Result Value   Sodium 133 (*)    CO2 18 (*)    Glucose, Bld 164 (*)    Calcium 8.7 (*)    All other components within normal limits  CBC - Abnormal; Notable for the following components:   WBC 13.2 (*)    RBC 4.15 (*)    MCV 101.9 (*)    MCH 34.2 (*)    All other components within normal limits  RESP PANEL BY RT-PCR (RSV, FLU A&B, COVID)  RVPGX2  RAPID URINE DRUG SCREEN, HOSP PERFORMED  TSH  TROPONIN I (HIGH SENSITIVITY)  TROPONIN I (HIGH SENSITIVITY)                                                                                                                          Radiology No results found.  Pertinent labs & imaging results that were available during my care of the patient were reviewed by me and considered in my medical decision making (see MDM for details).  Medications Ordered in ED Medications  lactated ringers bolus 1,000 mL (has no administration in time range)  Procedures .Critical Care  Performed by: Glendora Score, MD Authorized by: Glendora Score, MD   Critical care provider statement:    Critical care time (minutes):  30   Critical care was necessary to treat or prevent imminent or life-threatening deterioration of the following conditions:  Dehydration   Critical care was time spent personally by me on the following activities:  Development of treatment plan with patient or surrogate, discussions with consultants, evaluation of patient's response to treatment, examination of patient, ordering and review of laboratory studies, ordering  and review of radiographic studies, ordering and performing treatments and interventions, pulse oximetry, re-evaluation of patient's condition and review of old charts   (including critical care time)  Medical Decision Making / ED Course   This patient presents to the ED for concern of tachycardia, this involves an extensive number of treatment options, and is a complaint that carries with it a high risk of complications and morbidity.  The differential diagnosis includes sinus tachycardia, dysrhythmia, dehydration, anxiety, polysubstance use, hypothyroidism  MDM: Patient seen in the emergency room for evaluation of elevated heart rate.  Physical exam with a rapid regular tachycardia with rates in the 130s but is otherwise unremarkable.  Cardiopulmonary exam otherwise unremarkable with no appreciable murmur or wheezes on lung exam.  Laboratory evaluation with leukocytosis to 13.2, CO2 18, sodium 133, COVID, flu, RSV negative, UDS negative, TSH normal, high-sensitivity troponin unremarkable.  ECG with sinus tachycardia and no evidence of dysrhythmia.  Patient given single liter of lactated Ringer's with significant improvement of heart rates from the 130s down to the 1 teens.  Patient given additional liter of fluid and heart rates improved into the normal range, mid 90s when asleep, 105 when awake.  At this time we have had significant improvement of the patient's tachycardia I do suspect that this is likely secondary to dehydration.  He has not had any fever and blood pressures are normal and currently he does not meet SIRS criteria.  I have low suspicion for bacteremia at this time.  He was given very strict return precautions of which he voiced understanding and was discharged with outpatient follow-up.  He was encouraged to increase his hydration at home but return to the emergency department if he does develop a fever, vomiting, chest pain or any other concerning symptoms.   Additional history  obtained: -Additional history obtained from partner -External records from outside source obtained and reviewed including: Chart review including previous notes, labs, imaging, consultation notes   Lab Tests: -I ordered, reviewed, and interpreted labs.   The pertinent results include:   Labs Reviewed  BASIC METABOLIC PANEL - Abnormal; Notable for the following components:      Result Value   Sodium 133 (*)    CO2 18 (*)    Glucose, Bld 164 (*)    Calcium 8.7 (*)    All other components within normal limits  CBC - Abnormal; Notable for the following components:   WBC 13.2 (*)    RBC 4.15 (*)    MCV 101.9 (*)    MCH 34.2 (*)    All other components within normal limits  RESP PANEL BY RT-PCR (RSV, FLU A&B, COVID)  RVPGX2  RAPID URINE DRUG SCREEN, HOSP PERFORMED  TSH  TROPONIN I (HIGH SENSITIVITY)  TROPONIN I (HIGH SENSITIVITY)      EKG   EKG Interpretation Date/Time:  Monday January 14 2024 04:28:12 EST Ventricular Rate:  110 PR Interval:  163 QRS Duration:  68 QT  Interval:  322 QTC Calculation: 436 R Axis:   79  Text Interpretation: Sinus tachycardia Left ventricular hypertrophy Confirmed by Mithra Spano ([id number]) on 01/14/2024 5:36:16 AM         Medicines ordered and prescription drug management: Meds ordered this encounter  Medications   lactated ringers bolus 1,000 mL    -I have reviewed the patients home medicines and have made adjustments as needed  Critical interventions Fluid resuscitation    Cardiac Monitoring: The patient was maintained on a cardiac monitor.  I personally viewed and interpreted the cardiac monitored which showed an underlying rhythm of: Sinus tachycardia  Social Determinants of Health:  Factors impacting patients care include: none   Reevaluation: After the interventions noted above, I reevaluated the patient and found that they have :improved  Co morbidities that complicate the patient evaluation  Past Medical History:   Diagnosis Date   Distal radius fracture, left 08/2014      Dispostion: I considered admission for this patient, but at this time he does not meet inpatient criteria for admission and will be discharged with outpatient follow-up.     Final Clinical Impression(s) / ED Diagnoses Final diagnoses:  None     @PCDICTATION @    Glendora Score, MD 01/14/24 2103

## 2024-11-03 ENCOUNTER — Ambulatory Visit: Payer: Self-pay

## 2024-11-03 ENCOUNTER — Ambulatory Visit
Admission: EM | Admit: 2024-11-03 | Discharge: 2024-11-03 | Disposition: A | Discharge status: Home / Self Care | Payer: Self-pay

## 2024-11-03 ENCOUNTER — Encounter: Payer: Self-pay | Admitting: *Deleted

## 2024-11-03 DIAGNOSIS — S93601A Unspecified sprain of right foot, initial encounter: Secondary | ICD-10-CM

## 2024-11-03 MED ORDER — IBUPROFEN 800 MG PO TABS
800.0000 mg | ORAL_TABLET | Freq: Once | ORAL | Status: AC
  Administered 2024-11-03: 800 mg via ORAL

## 2024-11-03 NOTE — ED Notes (Signed)
 Discussed establishing PCP with pt. States that he will do so on his own. Declined making appt today. Also sent text for him to activate mychart.

## 2024-11-03 NOTE — Telephone Encounter (Signed)
 Pt returned call. Advised of results and recs to go to [facility]. Pt expressed that he does not currently have a ride and that he does not have insurance. Advised he may return to UC for boot application, but that ortho f/u is needed. [facility] info provided to pt. Verbalized understanding.

## 2024-11-03 NOTE — ED Triage Notes (Signed)
 States walking down stairs yesterday and right foot twisted. Denies fall. C/o pain in top of foot. Tried soaking his feet but no meds. States it hurt all night. States painful to walk, ambulated to room with limp. Swelling noted to top of right foot.

## 2024-11-03 NOTE — Discharge Instructions (Signed)
 Preliminary review of xray shows no fracture. Most likely a foot sprain. Please follow instructions below for pain relief and care.  Today you have been diagnosed with a musculoskeletal injury.  Adults may use 400 mg of ibuprofen  and 1000 mg of Tylenol  together every 8 hours as needed for pain control.  Children may take ibuprofen  and Tylenol  as directed on medication packaging.  You should use ice on affected area for 20 minutes at a time a couple times a day for the first 24 hours then you may switch to heat in the same intervals.  Be sure to put a barrier between ice or heat source and skin to prevent burns.  May also wrap affected area and Ace bandage if tolerated and appropriate, and elevate above the level of the heart to help reduce swelling.  Do not wrap Ace bandages around neck or torso as wrapping too tight can restrict air movement inability to breathe.  If symptoms do not seem to be improving in 3 to 5 days after following these instructions we need to follow-up with orthopedist or PCP.

## 2024-11-03 NOTE — ED Provider Notes (Signed)
 [name]    CSN: [id number] Arrival date & time: 11/03/24  0801      History   Chief Complaint Chief Complaint  Patient presents with   Foot Injury    HPI [name] [name] is a 43 y.o. male.   Pt presents today due to inversion injury of right foot when walking downstairs yesterday. Pt states that he is experiencing 10/10 pain with weight bearing but denies pain at rest. Pt has edema of dorsal lateral aspect of foot. Pt states that he attempted to soak foot but denies use of medicine for pain.   The history is provided by the patient.  Foot Injury   Past Medical History:  Diagnosis Date   Distal radius fracture, left 08/2014    There are no active problems to display for this patient.   Past Surgical History:  Procedure Laterality Date   FEMUR FRACTURE SURGERY Left 07/29/2008   closed treatment medial femoral epicondyle fx.   KNEE ASPIRATION Bilateral 07/29/2008   OPEN REDUCTION INTERNAL FIXATION (ORIF) DISTAL RADIAL FRACTURE Left 09/07/2014   Procedure: OPEN REDUCTION INTERNAL FIXATION (ORIF) DISTAL RADIAL FRACTURE;  Surgeon: Alm DELENA Hummer, MD;  Location: [facility];  Service: Orthopedics;  Laterality: Left;  Open treatment left distal radius fracture   WOUND DEBRIDEMENT Left 07/29/2008   knee       Home Medications    Prior to Admission medications   Not on File    Family History Family History  Problem Relation Age of Onset   Hypertension Mother    Hypertension Other     Social History Social History   Tobacco Use   Smoking status: Every Day    Current packs/day: 0.50    Average packs/day: 0.5 packs/day for 10.0 years (5.0 ttl pk-yrs)    Types: Cigarettes   Smokeless tobacco: Never  Vaping Use   Vaping status: Never Used  Substance Use Topics   Alcohol use: Yes    Comment: occasionally   Drug use: No     Allergies   Shrimp [shellfish allergy]   Review of Systems Review of Systems   Physical  Exam Triage Vital Signs ED Triage Vitals  Encounter Vitals Group     BP 11/03/24 0816 (!) 183/97     Girls Systolic BP Percentile --      Girls Diastolic BP Percentile --      Boys Systolic BP Percentile --      Boys Diastolic BP Percentile --      Pulse Rate 11/03/24 0816 99     Resp 11/03/24 0816 18     Temp 11/03/24 0816 98.7 F (37.1 C)     Temp Source 11/03/24 0816 Oral     SpO2 11/03/24 0816 97 %     Weight --      Height --      Head Circumference --      Peak Flow --      Pain Score 11/03/24 0812 5     Pain Loc --      Pain Education --      Exclude from Growth Chart --    No data found.  Updated Vital Signs BP (!) 183/97 (BP Location: Left Arm)   Pulse 99   Temp 98.7 F (37.1 C) (Oral)   Resp 18   SpO2 97%   Visual Acuity Right Eye Distance:   Left Eye Distance:   Bilateral Distance:    Right Eye Near:  Left Eye Near:    Bilateral Near:     Physical Exam Vitals and nursing note reviewed.  Constitutional:      General: He is not in acute distress.    Appearance: Normal appearance. He is not ill-appearing, toxic-appearing or diaphoretic.  Eyes:     General: No scleral icterus. Cardiovascular:     Rate and Rhythm: Normal rate and regular rhythm.     Heart sounds: Normal heart sounds.  Pulmonary:     Effort: Pulmonary effort is normal. No respiratory distress.     Breath sounds: Normal breath sounds. No wheezing or rhonchi.  Musculoskeletal:       Legs:     Comments: Tenderness to palpation where indicated on diagram. Violaceous ecchymosis noted of dorsal lateral aspect of right foot   Skin:    General: Skin is warm.  Neurological:     Mental Status: He is alert and oriented to person, place, and time.  Psychiatric:        Mood and Affect: Mood normal.        Behavior: Behavior normal.      UC Treatments / Results  Labs (all labs ordered are listed, but only abnormal results are displayed) Labs Reviewed - No data to  display  EKG   Radiology No results found.  Procedures Procedures (including critical care time)  Medications Ordered in UC Medications  ibuprofen  (ADVIL ) tablet 800 mg (800 mg Oral Given 11/03/24 0824)    Initial Impression / Assessment and Plan / UC Course  I have reviewed the triage vital signs and the nursing notes.  Pertinent labs & imaging results that were available during my care of the patient were reviewed by me and considered in my medical decision making (see chart for details).    Final Clinical Impressions(s) / UC Diagnoses   Final diagnoses:  Sprain of right foot, initial encounter     Discharge Instructions      Preliminary review of xray shows no fracture. Most likely a foot sprain. Please follow instructions below for pain relief and care.  Today you have been diagnosed with a musculoskeletal injury.  Adults may use 400 mg of ibuprofen  and 1000 mg of Tylenol  together every 8 hours as needed for pain control.  Children may take ibuprofen  and Tylenol  as directed on medication packaging.  You should use ice on affected area for 20 minutes at a time a couple times a day for the first 24 hours then you may switch to heat in the same intervals.  Be sure to put a barrier between ice or heat source and skin to prevent burns.  May also wrap affected area and Ace bandage if tolerated and appropriate, and elevate above the level of the heart to help reduce swelling.  Do not wrap Ace bandages around neck or torso as wrapping too tight can restrict air movement inability to breathe.  If symptoms do not seem to be improving in 3 to 5 days after following these instructions we need to follow-up with orthopedist or PCP.      ED Prescriptions   None    PDMP not reviewed this encounter.   Andra Corean BROCKS, PA-C 11/03/24 [phone]

## 2024-11-04 NOTE — Telephone Encounter (Signed)
 Pt LVM stating he will return to UC for boot on Wed 11/05/2024.
